# Patient Record
Sex: Female | Born: 1990 | Race: Black or African American | Hispanic: No | Marital: Single | Smoking: Former smoker
Health system: Southern US, Community
[De-identification: ages and names within clinical notes are randomized; demographics above are authoritative.]

## PROBLEM LIST (undated history)

## (undated) DIAGNOSIS — D649 Anemia, unspecified: Secondary | ICD-10-CM

## (undated) DIAGNOSIS — R87619 Unspecified abnormal cytological findings in specimens from cervix uteri: Secondary | ICD-10-CM

## (undated) DIAGNOSIS — N83209 Unspecified ovarian cyst, unspecified side: Secondary | ICD-10-CM

## (undated) DIAGNOSIS — E079 Disorder of thyroid, unspecified: Secondary | ICD-10-CM

## (undated) DIAGNOSIS — F419 Anxiety disorder, unspecified: Secondary | ICD-10-CM

## (undated) HISTORY — PX: WISDOM TOOTH EXTRACTION: SHX21

## (undated) HISTORY — PX: COLPOSCOPY: SHX161

## (undated) HISTORY — DX: Unspecified abnormal cytological findings in specimens from cervix uteri: R87.619

## (undated) HISTORY — DX: Anxiety disorder, unspecified: F41.9

## (undated) HISTORY — DX: Anemia, unspecified: D64.9

---

## 2010-11-15 ENCOUNTER — Inpatient Hospital Stay (INDEPENDENT_AMBULATORY_CARE_PROVIDER_SITE_OTHER)
Admission: RE | Admit: 2010-11-15 | Discharge: 2010-11-15 | Disposition: A | Discharge status: Home / Self Care | Payer: Self-pay | Source: Ambulatory Visit | Attending: Family Medicine | Admitting: Family Medicine

## 2010-11-15 DIAGNOSIS — H5710 Ocular pain, unspecified eye: Secondary | ICD-10-CM

## 2011-03-08 ENCOUNTER — Encounter: Payer: Self-pay | Admitting: Emergency Medicine

## 2011-03-08 ENCOUNTER — Emergency Department (HOSPITAL_BASED_OUTPATIENT_CLINIC_OR_DEPARTMENT_OTHER)
Admission: EM | Admit: 2011-03-08 | Discharge: 2011-03-08 | Disposition: A | Discharge status: Home / Self Care | Payer: PRIVATE HEALTH INSURANCE | Attending: Emergency Medicine | Admitting: Emergency Medicine

## 2011-03-08 DIAGNOSIS — R51 Headache: Secondary | ICD-10-CM | POA: Insufficient documentation

## 2011-03-08 DIAGNOSIS — J069 Acute upper respiratory infection, unspecified: Secondary | ICD-10-CM | POA: Insufficient documentation

## 2011-03-08 NOTE — ED Notes (Signed)
Headache with nausea and vomiting onset this am

## 2011-03-08 NOTE — ED Provider Notes (Signed)
History     CSN: [id number] Arrival date & time: 03/08/2011 11:29 AM   First MD Initiated Contact with Patient 03/08/11 1214    Patient in room 7 hped  Chief Complaint  Patient presents with  . Headache  Patient awoke this a.m. Early with headache nasal congestion, no fever, sneezing, scratchy throat, no cough.  Worst symptom is frontal headache and nausea.  Patient vomited once.  Headache is sharp.  Patient works as cna.    (Consider location/radiation/quality/duration/timing/severity/associated sxs/prior treatment) The history is provided by the patient.    History reviewed. No pertinent past medical history.  History reviewed. No pertinent past surgical history.  No family history on file.  History  Substance Use Topics  . Smoking status: Not on file  . Smokeless tobacco: Not on file  . Alcohol Use: Not on file    OB History    Grav Para Term Preterm Abortions TAB SAB Ect Mult Living                  Review of Systems  All other systems reviewed and are negative.    Allergies  Review of patient's allergies indicates not on file.  Home Medications  No current outpatient prescriptions on file.  BP 124/78  Pulse 53  Temp(Src) 98.1 F (36.7 C) (Oral)  Resp 18  Ht 5\' 7"  (1.702 m)  Wt 175 lb (79.379 kg)  BMI 27.41 kg/m2  SpO2 100%  Physical Exam  Nursing note and vitals reviewed. Constitutional: She appears well-developed and well-nourished.  HENT:  Head: Normocephalic and atraumatic.  Eyes: Conjunctivae and EOM are normal. Pupils are equal, round, and reactive to light.  Neck: Normal range of motion. Neck supple.  Cardiovascular: Normal rate and regular rhythm.   Pulmonary/Chest: Effort normal and breath sounds normal.  Abdominal: Soft. Bowel sounds are normal.  Musculoskeletal: Normal range of motion.  Neurological: She is alert. She has normal reflexes.  Skin: Skin is warm and dry.  Psychiatric: She has a normal mood and affect. Her behavior is  normal. Judgment and thought content normal.    ED Course  Procedures (including critical care time)  Labs Reviewed - No data to display No results found.   No diagnosis found.    MDM    Patient with normal hr of 60 on my exam.  Symtpoms c.w. Glenford Peers.        Hilario Quarry, MD 03/08/11 1341

## 2011-06-07 ENCOUNTER — Emergency Department (INDEPENDENT_AMBULATORY_CARE_PROVIDER_SITE_OTHER): Payer: No Typology Code available for payment source

## 2011-06-07 ENCOUNTER — Emergency Department (HOSPITAL_BASED_OUTPATIENT_CLINIC_OR_DEPARTMENT_OTHER)
Admission: EM | Admit: 2011-06-07 | Discharge: 2011-06-07 | Disposition: A | Discharge status: Home / Self Care | Payer: No Typology Code available for payment source | Attending: Emergency Medicine | Admitting: Emergency Medicine

## 2011-06-07 ENCOUNTER — Encounter (HOSPITAL_BASED_OUTPATIENT_CLINIC_OR_DEPARTMENT_OTHER): Payer: Self-pay | Admitting: *Deleted

## 2011-06-07 DIAGNOSIS — S139XXA Sprain of joints and ligaments of unspecified parts of neck, initial encounter: Secondary | ICD-10-CM | POA: Insufficient documentation

## 2011-06-07 DIAGNOSIS — F17200 Nicotine dependence, unspecified, uncomplicated: Secondary | ICD-10-CM | POA: Insufficient documentation

## 2011-06-07 DIAGNOSIS — M25569 Pain in unspecified knee: Secondary | ICD-10-CM

## 2011-06-07 DIAGNOSIS — Y92410 Unspecified street and highway as the place of occurrence of the external cause: Secondary | ICD-10-CM | POA: Insufficient documentation

## 2011-06-07 DIAGNOSIS — M542 Cervicalgia: Secondary | ICD-10-CM

## 2011-06-07 DIAGNOSIS — E041 Nontoxic single thyroid nodule: Secondary | ICD-10-CM

## 2011-06-07 DIAGNOSIS — R079 Chest pain, unspecified: Secondary | ICD-10-CM

## 2011-06-07 DIAGNOSIS — R51 Headache: Secondary | ICD-10-CM | POA: Insufficient documentation

## 2011-06-07 DIAGNOSIS — S161XXA Strain of muscle, fascia and tendon at neck level, initial encounter: Secondary | ICD-10-CM

## 2011-06-07 MED ORDER — OXYCODONE-ACETAMINOPHEN 5-325 MG PO TABS: 1.0000 | ORAL_TABLET | ORAL | Status: AC | PRN

## 2011-06-07 MED ORDER — OXYCODONE-ACETAMINOPHEN 5-325 MG PO TABS
1.0000 | ORAL_TABLET | Freq: Once | ORAL | Status: AC
  Administered 2011-06-07: 1 via ORAL
  Filled 2011-06-07: qty 1
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
## 2011-09-03 ENCOUNTER — Emergency Department (HOSPITAL_BASED_OUTPATIENT_CLINIC_OR_DEPARTMENT_OTHER)
Admission: EM | Admit: 2011-09-03 | Discharge: 2011-09-04 | Disposition: A | Discharge status: Home / Self Care | Payer: PRIVATE HEALTH INSURANCE | Attending: Emergency Medicine | Admitting: Emergency Medicine

## 2011-09-03 ENCOUNTER — Encounter (HOSPITAL_BASED_OUTPATIENT_CLINIC_OR_DEPARTMENT_OTHER): Payer: Self-pay | Admitting: *Deleted

## 2011-09-03 DIAGNOSIS — R51 Headache: Secondary | ICD-10-CM | POA: Insufficient documentation

## 2011-09-03 NOTE — ED Notes (Signed)
Pt states was seen at [facility] x 1 day ago for seizure and states she still has h/a

## 2011-09-04 MED ORDER — METOCLOPRAMIDE HCL 5 MG/ML IJ SOLN
10.0000 mg | Freq: Once | INTRAMUSCULAR | Status: AC
  Administered 2011-09-04: 10 mg via INTRAMUSCULAR
  Filled 2011-09-04: qty 2

## 2011-09-04 MED ORDER — DIPHENHYDRAMINE HCL 50 MG/ML IJ SOLN
25.0000 mg | Freq: Once | INTRAMUSCULAR | Status: AC
  Administered 2011-09-04: 25 mg via INTRAMUSCULAR
  Filled 2011-09-04: qty 1

## 2011-09-04 MED ORDER — KETOROLAC TROMETHAMINE 60 MG/2ML IM SOLN
60.0000 mg | Freq: Once | INTRAMUSCULAR | Status: AC
  Administered 2011-09-04: 60 mg via INTRAMUSCULAR
  Filled 2011-09-04: qty 2
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
## 2011-09-04 NOTE — Discharge Instructions (Signed)

## 2011-09-04 NOTE — ED Notes (Signed)
MD at bedside. 

## 2011-09-04 NOTE — ED Provider Notes (Signed)
History     CSN: [id number]  Arrival date & time 09/03/11  2214   First MD Initiated Contact with Patient 09/04/11 0023      Chief Complaint  Patient presents with  . Headache    (Consider location/radiation/quality/duration/timing/severity/associated sxs/prior treatment) Patient is a 21 y.o. female presenting with headaches. The history is provided by the patient. No language interpreter was used.  Headache  This is a new problem. The current episode started yesterday. The problem occurs constantly. The problem has not changed since onset.The headache is associated with nothing. The pain is located in the temporal (left) region. The quality of the pain is described as throbbing. The pain is at a severity of 9/10. The pain does not radiate. Pertinent negatives include no anorexia, no fever, no malaise/fatigue, no near-syncope, no orthopnea, no palpitations, no shortness of breath, no nausea and no vomiting. She has tried nothing for the symptoms. The treatment provided no relief.  Seen at [facility] last evening for AMS following alcohol and an ingestion (records from [facility] scanned in to chart) patient had an isolated seizure and had a negative CT scan but has a headache.  No visual nor speech changes no f/c/r.  No vomiting.  No rashes on the skin.    Past Medical History  Diagnosis Date  . Stroke     History reviewed. No pertinent past surgical history.  History reviewed. No pertinent family history.  History  Substance Use Topics  . Smoking status: Current Some Day Smoker  . Smokeless tobacco: Not on file  . Alcohol Use: No    OB History    Grav Para Term Preterm Abortions TAB SAB Ect Mult Living                  Review of Systems  Constitutional: Negative for fever and malaise/fatigue.  HENT: Negative for neck stiffness.   Eyes: Negative for photophobia and visual disturbance.  Respiratory: Negative for shortness of breath.   Cardiovascular: Negative for palpitations,  orthopnea and near-syncope.  Gastrointestinal: Negative for nausea, vomiting and anorexia.  Neurological: Positive for headaches. Negative for dizziness, tremors, facial asymmetry and numbness.  All other systems reviewed and are negative.    Allergies  Review of patient's allergies indicates no known allergies.  Home Medications  No current outpatient prescriptions on file.  BP 119/73  Pulse 63  Temp 97.6 F (36.4 C)  Resp 16  Ht 5\' 7"  (1.702 m)  Wt 191 lb (86.637 kg)  BMI 29.91 kg/m2  SpO2 100%  LMP 08/20/2011  Physical Exam  Constitutional: She is oriented to person, place, and time. She appears well-developed and well-nourished. No distress.  HENT:  Head: Normocephalic and atraumatic.  Right Ear: No mastoid tenderness. Tympanic membrane is not injected. No hemotympanum.  Left Ear: No mastoid tenderness. Tympanic membrane is not injected. No hemotympanum.  Mouth/Throat: Oropharynx is clear and moist. No oropharyngeal exudate.  Eyes: Conjunctivae and EOM are normal. Pupils are equal, round, and reactive to light.  Neck: Normal range of motion. Neck supple. No tracheal deviation present. No thyromegaly present.  Cardiovascular: Normal rate and regular rhythm.   Pulmonary/Chest: Effort normal and breath sounds normal.  Abdominal: Bowel sounds are normal. There is no tenderness. There is no rebound and no guarding.  Musculoskeletal: Normal range of motion.  Lymphadenopathy:    She has no cervical adenopathy.  Neurological: She is alert and oriented to person, place, and time. She has normal reflexes. No cranial nerve deficit. Coordination  normal.  Skin: Skin is warm and dry.  Psychiatric: She has a normal mood and affect.    ED Course  Procedures (including critical care time)   Labs Reviewed  PREGNANCY, URINE   No results found.   1. Headache       MDM  Negative work up within the past 24 hours.  No focal deficits.  No indication for LP.  Will treat and  have patient follow up with neurology.  Patient informed no driving x 6 months or until cleared by neurology.  Patient verbalizes understanding and agrees to follow up        Kesean Serviss Smitty Cords, MD 09/04/11 4098

## 2011-09-11 NOTE — ED Notes (Signed)
Patient here for work note.

## 2011-10-07 ENCOUNTER — Other Ambulatory Visit: Payer: Self-pay | Admitting: Diagnostic Neuroimaging

## 2011-10-07 DIAGNOSIS — R569 Unspecified convulsions: Secondary | ICD-10-CM

## 2011-11-28 ENCOUNTER — Encounter (HOSPITAL_BASED_OUTPATIENT_CLINIC_OR_DEPARTMENT_OTHER): Payer: Self-pay | Admitting: *Deleted

## 2011-11-28 ENCOUNTER — Emergency Department (HOSPITAL_BASED_OUTPATIENT_CLINIC_OR_DEPARTMENT_OTHER)
Admission: EM | Admit: 2011-11-28 | Discharge: 2011-11-28 | Disposition: A | Discharge status: Home / Self Care | Payer: PRIVATE HEALTH INSURANCE | Attending: Emergency Medicine | Admitting: Emergency Medicine

## 2011-11-28 DIAGNOSIS — F17200 Nicotine dependence, unspecified, uncomplicated: Secondary | ICD-10-CM | POA: Insufficient documentation

## 2011-11-28 DIAGNOSIS — R51 Headache: Secondary | ICD-10-CM | POA: Insufficient documentation

## 2011-11-28 HISTORY — DX: Disorder of thyroid, unspecified: E07.9

## 2011-11-28 MED ORDER — DEXAMETHASONE SODIUM PHOSPHATE 10 MG/ML IJ SOLN
10.0000 mg | Freq: Once | INTRAMUSCULAR | Status: AC
  Administered 2011-11-28: 10 mg via INTRAVENOUS
  Filled 2011-11-28: qty 1

## 2011-11-28 MED ORDER — METOCLOPRAMIDE HCL 5 MG/ML IJ SOLN
10.0000 mg | Freq: Once | INTRAMUSCULAR | Status: AC
  Administered 2011-11-28: 10 mg via INTRAVENOUS
  Filled 2011-11-28: qty 2

## 2011-11-28 MED ORDER — DIPHENHYDRAMINE HCL 50 MG/ML IJ SOLN
12.5000 mg | Freq: Once | INTRAMUSCULAR | Status: AC
  Administered 2011-11-28: 12.5 mg via INTRAVENOUS
  Filled 2011-11-28: qty 1

## 2011-11-28 NOTE — ED Notes (Signed)
Headache off and on since Monday describes as throbbing in top of head and pain behind eyes vomited x 2 this am

## 2011-11-28 NOTE — ED Provider Notes (Signed)
History     CSN: [id number]  Arrival date & time 11/28/11  1255   First MD Initiated Contact with Patient 11/28/11 1427      Chief Complaint  Patient presents with  . Headache    (Consider location/radiation/quality/duration/timing/severity/associated sxs/prior treatment) Patient is a 21 y.o. female presenting with headaches. The history is provided by the patient.  Headache  This is a new problem. The current episode started more than 2 days ago. The problem occurs constantly. The problem has not changed since onset.The headache is associated with bright light. The pain is located in the parietal, frontal and bilateral region. The pain is moderate. Associated symptoms include nausea and vomiting. Pertinent negatives include no fever. Associated symptoms comments: Headache for the past 5 days that is worse in bright light. No injury. No fever. She has had nausea with limited vomiting. It is not getting better or worse over time. Ibuprofen and Tylenol attempted without relief. .    Past Medical History  Diagnosis Date  . Thyroid disease     History reviewed. No pertinent past surgical history.  History reviewed. No pertinent family history.  History  Substance Use Topics  . Smoking status: Current Some Day Smoker  . Smokeless tobacco: Not on file  . Alcohol Use: No    OB History    Grav Para Term Preterm Abortions TAB SAB Ect Mult Living                  Review of Systems  Constitutional: Negative for fever.  HENT: Negative for congestion and sinus pressure.   Eyes: Positive for photophobia.  Gastrointestinal: Positive for nausea and vomiting. Negative for abdominal distention.  Skin: Negative for rash.  Neurological: Positive for headaches.    Allergies  Review of patient's allergies indicates no known allergies.  Home Medications  No current outpatient prescriptions on file.  BP 122/79  Pulse 79  Temp 98 F (36.7 C) (Oral)  Resp 18  Ht 5\' 7"  (1.702 m)   Wt 198 lb (89.812 kg)  BMI 31.01 kg/m2  SpO2 98%  LMP 11/16/2011  Physical Exam  Constitutional: She is oriented to person, place, and time. She appears well-developed and well-nourished.  HENT:  Head: Normocephalic.  Eyes: Pupils are equal, round, and reactive to light.  Neck: Normal range of motion. Neck supple.  Cardiovascular: Normal rate and regular rhythm.   Pulmonary/Chest: Effort normal and breath sounds normal.  Abdominal: Soft. Bowel sounds are normal. There is no tenderness. There is no rebound and no guarding.  Musculoskeletal: Normal range of motion.  Neurological: She is alert and oriented to person, place, and time. She has normal strength and normal reflexes. No sensory deficit. She displays a negative Romberg sign. Coordination normal.       Cranial nerves 3-12 grossly intact. No cerebellar dysfunction. Normal gait.   Skin: Skin is warm and dry. No rash noted.  Psychiatric: She has a normal mood and affect.    ED Course  Procedures (including critical care time)  Labs Reviewed - No data to display No results found.   No diagnosis found.  1. Headache   MDM  Headache the same for 5 days without neurologic deficits on exam. Doubt bleed or infection as cause of headache. Will give symptomatic treatment and reassess.   4:30:  Patient's headache is completely resolved. Will discharge home.        Rodena Medin, PA-C 11/28/11 1631

## 2011-11-29 NOTE — ED Provider Notes (Signed)
Medical screening examination/treatment/procedure(s) were performed by non-physician practitioner and as supervising physician I was immediately available for consultation/collaboration.   Tirth Cothron Y. Annelise Mccoy, MD 11/29/11 0906 

## 2012-03-09 ENCOUNTER — Emergency Department (HOSPITAL_BASED_OUTPATIENT_CLINIC_OR_DEPARTMENT_OTHER)
Admission: EM | Admit: 2012-03-09 | Discharge: 2012-03-09 | Disposition: A | Discharge status: Home / Self Care | Payer: Self-pay | Attending: Emergency Medicine | Admitting: Emergency Medicine

## 2012-03-09 ENCOUNTER — Encounter (HOSPITAL_BASED_OUTPATIENT_CLINIC_OR_DEPARTMENT_OTHER): Payer: Self-pay | Admitting: Emergency Medicine

## 2012-03-09 ENCOUNTER — Emergency Department (HOSPITAL_BASED_OUTPATIENT_CLINIC_OR_DEPARTMENT_OTHER): Payer: Self-pay

## 2012-03-09 DIAGNOSIS — M940 Chondrocostal junction syndrome [Tietze]: Secondary | ICD-10-CM | POA: Insufficient documentation

## 2012-03-09 DIAGNOSIS — E079 Disorder of thyroid, unspecified: Secondary | ICD-10-CM | POA: Insufficient documentation

## 2012-03-09 DIAGNOSIS — F17200 Nicotine dependence, unspecified, uncomplicated: Secondary | ICD-10-CM | POA: Insufficient documentation

## 2012-03-09 MED ORDER — NAPROXEN 500 MG PO TABS: 500.0000 mg | ORAL_TABLET | Freq: Two times a day (BID) | ORAL | Status: DC

## 2012-03-09 MED ORDER — ALBUTEROL SULFATE HFA 108 (90 BASE) MCG/ACT IN AERS
1.0000 | INHALATION_SPRAY | RESPIRATORY_TRACT | Status: DC | PRN
  Administered 2012-03-09: 2 via RESPIRATORY_TRACT
  Filled 2012-03-09: qty 6.7

## 2012-03-09 MED ORDER — IBUPROFEN 800 MG PO TABS
800.0000 mg | ORAL_TABLET | Freq: Once | ORAL | Status: AC
  Administered 2012-03-09: 800 mg via ORAL
  Filled 2012-03-09: qty 1

## 2012-03-09 NOTE — ED Provider Notes (Signed)
History     CSN: [id number] Arrival date & time 03/09/12  2009 First MD Initiated Contact with Patient 03/09/12 2148     Chief Complaint  Patient presents with  . Shortness of Breath   Patient is a 21 y.o. female presenting with shortness of breath. The history is provided by the patient.  Shortness of Breath  The current episode started yesterday. The onset was gradual. The problem occurs continuously. The problem is mild. Nothing relieves the symptoms. Nothing aggravates the symptoms. Associated symptoms include chest pain, shortness of breath and wheezing. Pertinent negatives include no fever, no rhinorrhea, no sore throat and no cough. Her past medical history does not include asthma.  No leg swelling.  No OCP.  No travel.  No history of DVT or PE.  Past Medical History  Diagnosis Date  . Thyroid disease     History reviewed. No pertinent past surgical history.  No family history on file.  History  Substance Use Topics  . Smoking status: Current Every Day Smoker    Types: Cigars  . Smokeless tobacco: Never Used  . Alcohol Use: No    OB History    Grav Para Term Preterm Abortions TAB SAB Ect Mult Living                  Review of Systems  Constitutional: Negative for fever.  HENT: Negative for sore throat and rhinorrhea.   Respiratory: Positive for shortness of breath and wheezing. Negative for cough.   Cardiovascular: Positive for chest pain.  All other systems reviewed and are negative.    Allergies  Review of patient's allergies indicates no known allergies.  Home Medications   Current Outpatient Rx  Name Route Sig Dispense Refill  . IBUPROFEN 200 MG PO TABS Oral Take 200 mg by mouth every 6 (six) hours as needed. Patient used this medication for her headache.      BP 113/65  Pulse 64  Temp 98.2 F (36.8 C) (Oral)  Resp 20  Ht 5\' 6"  (1.676 m)  Wt 201 lb (91.173 kg)  BMI 32.44 kg/m2  SpO2 100%  LMP 03/06/2012  Physical Exam  Nursing note and  vitals reviewed. Constitutional: She appears well-developed and well-nourished. No distress.  HENT:  Head: Normocephalic and atraumatic.  Right Ear: External ear normal.  Left Ear: External ear normal.  Eyes: Conjunctivae normal are normal. Right eye exhibits no discharge. Left eye exhibits no discharge. No scleral icterus.  Neck: Neck supple. No tracheal deviation present.  Cardiovascular: Normal rate, regular rhythm and intact distal pulses.   Pulmonary/Chest: Effort normal and breath sounds normal. No stridor. No respiratory distress. She has no wheezes. She has no rales. She exhibits tenderness.  Abdominal: Soft. Bowel sounds are normal. She exhibits no distension. There is no tenderness. There is no rebound and no guarding.  Musculoskeletal: She exhibits no edema and no tenderness.  Neurological: She is alert. She has normal strength. No sensory deficit. Cranial nerve deficit:  no gross defecits noted. She exhibits normal muscle tone. She displays no seizure activity. Coordination normal.  Skin: Skin is warm and dry. No rash noted.  Psychiatric: She has a normal mood and affect.    ED Course  Procedures (including critical care time)  Rate: 58  Rhythm: normal sinus rhythm  QRS Axis: normal  Intervals: normal  ST/T Wave abnormalities: normal  Conduction Disutrbances:none  Narrative Interpretation: Sinus arrhythmia  Old EKG Reviewed: none available  Labs Reviewed - No data to  display Dg Chest 2 View  03/09/2012  *RADIOLOGY REPORT*  Clinical Data: Shortness of breath.  CHEST - 2 VIEW  Comparison: 05/28/2011  Findings: Two views of the chest demonstrate clear lungs. Heart and mediastinum are within normal limits.  The trachea is midline. Bony thorax is intact.  IMPRESSION: Normal chest examination.   Original Report Authenticated By: Richarda Overlie, M.D.       MDM  Patient is low risk for PE. She is perc negative.  Suspect her symptoms may be related to costochondritis. She is not  wheezing here but she felt she was wheezing earlier. She be discharged home with a prescription for anti-inflammatory medications and inhaler. When she discontinue smoking. She is to follow up with a primary care Dr. if the symptoms are not getting better. She is to return emergently for any worsening symptoms        Celene Kras, MD 03/09/12 2218

## 2012-03-09 NOTE — ED Notes (Signed)
Shortness of breath since yesterday.  Worse last night and today. Wheezing.  Denies hx of Asthma or similar sx.  Denies recent URI. Sts she is a smoker.

## 2012-10-23 DIAGNOSIS — J302 Other seasonal allergic rhinitis: Secondary | ICD-10-CM | POA: Insufficient documentation

## 2012-10-23 DIAGNOSIS — R569 Unspecified convulsions: Secondary | ICD-10-CM | POA: Insufficient documentation

## 2012-10-23 DIAGNOSIS — N939 Abnormal uterine and vaginal bleeding, unspecified: Secondary | ICD-10-CM | POA: Insufficient documentation

## 2012-10-23 DIAGNOSIS — J3089 Other allergic rhinitis: Secondary | ICD-10-CM | POA: Insufficient documentation

## 2016-10-27 DIAGNOSIS — L230 Allergic contact dermatitis due to metals: Secondary | ICD-10-CM | POA: Insufficient documentation

## 2017-02-20 ENCOUNTER — Emergency Department (HOSPITAL_BASED_OUTPATIENT_CLINIC_OR_DEPARTMENT_OTHER)
Admission: EM | Admit: 2017-02-20 | Discharge: 2017-02-21 | Disposition: A | Discharge status: Home / Self Care | Payer: PRIVATE HEALTH INSURANCE | Attending: Emergency Medicine | Admitting: Emergency Medicine

## 2017-02-20 ENCOUNTER — Encounter (HOSPITAL_BASED_OUTPATIENT_CLINIC_OR_DEPARTMENT_OTHER): Payer: Self-pay | Admitting: Adult Health

## 2017-02-20 DIAGNOSIS — N939 Abnormal uterine and vaginal bleeding, unspecified: Secondary | ICD-10-CM

## 2017-02-20 DIAGNOSIS — E079 Disorder of thyroid, unspecified: Secondary | ICD-10-CM | POA: Insufficient documentation

## 2017-02-20 DIAGNOSIS — N83202 Unspecified ovarian cyst, left side: Secondary | ICD-10-CM | POA: Diagnosis not present

## 2017-02-20 DIAGNOSIS — R102 Pelvic and perineal pain: Secondary | ICD-10-CM | POA: Insufficient documentation

## 2017-02-20 DIAGNOSIS — N938 Other specified abnormal uterine and vaginal bleeding: Secondary | ICD-10-CM | POA: Diagnosis present

## 2017-02-20 DIAGNOSIS — F17290 Nicotine dependence, other tobacco product, uncomplicated: Secondary | ICD-10-CM | POA: Diagnosis not present

## 2017-02-20 LAB — COMPREHENSIVE METABOLIC PANEL
ALBUMIN: 3.9 g/dL (ref 3.5–5.0)
ALT: 18 U/L (ref 14–54)
AST: 18 U/L (ref 15–41)
Alkaline Phosphatase: 41 U/L (ref 38–126)
Anion gap: 5 (ref 5–15)
BILIRUBIN TOTAL: 0.3 mg/dL (ref 0.3–1.2)
BUN: 12 mg/dL (ref 6–20)
CO2: 25 mmol/L (ref 22–32)
Calcium: 9.1 mg/dL (ref 8.9–10.3)
Chloride: 107 mmol/L (ref 101–111)
Creatinine, Ser: 0.64 mg/dL (ref 0.44–1.00)
GFR calc Af Amer: >60 mL/min (ref >60)
GFR calc non Af Amer: >60 mL/min (ref >60)
GLUCOSE: 100 mg/dL — AB (ref 65–99)
POTASSIUM: 3.9 mmol/L (ref 3.5–5.1)
Sodium: 137 mmol/L (ref 135–145)
TOTAL PROTEIN: 7.1 g/dL (ref 6.5–8.1)

## 2017-02-20 LAB — URINALYSIS, ROUTINE W REFLEX MICROSCOPIC
Bilirubin Urine: NEGATIVE
GLUCOSE, UA: NEGATIVE mg/dL
Ketones, ur: NEGATIVE mg/dL
LEUKOCYTES UA: NEGATIVE
Nitrite: NEGATIVE
PROTEIN: NEGATIVE mg/dL
Specific Gravity, Urine: 1.025 (ref 1.005–1.030)
pH: 6 (ref 5.0–8.0)

## 2017-02-20 LAB — CBC WITH DIFFERENTIAL/PLATELET
BASOS PCT: 0 %
Basophils Absolute: 0 10*3/uL (ref 0.0–0.1)
EOS PCT: 2 %
Eosinophils Absolute: 0.1 10*3/uL (ref 0.0–0.7)
HEMATOCRIT: 36.6 % (ref 36.0–46.0)
Hemoglobin: 11.9 g/dL — ABNORMAL LOW (ref 12.0–15.0)
Lymphocytes Relative: 39 %
Lymphs Abs: 2.5 10*3/uL (ref 0.7–4.0)
MCH: 26.9 pg (ref 26.0–34.0)
MCHC: 32.5 g/dL (ref 30.0–36.0)
MCV: 82.6 fL (ref 78.0–100.0)
MONO ABS: 0.5 10*3/uL (ref 0.1–1.0)
MONOS PCT: 8 %
NEUTROS ABS: 3.4 10*3/uL (ref 1.7–7.7)
Neutrophils Relative %: 51 %
PLATELETS: 257 10*3/uL (ref 150–400)
RBC: 4.43 MIL/uL (ref 3.87–5.11)
RDW: 13.4 % (ref 11.5–15.5)
WBC: 6.5 10*3/uL (ref 4.0–10.5)

## 2017-02-20 LAB — URINALYSIS, MICROSCOPIC (REFLEX): WBC, UA: NONE SEEN WBC/hpf (ref 0–5)

## 2017-02-20 LAB — WET PREP, GENITAL
SPERM: NONE SEEN
TRICH WET PREP: NONE SEEN
YEAST WET PREP: NONE SEEN

## 2017-02-20 LAB — PREGNANCY, URINE: PREG TEST UR: NEGATIVE

## 2017-02-20 MED ORDER — ONDANSETRON HCL 4 MG/2ML IJ SOLN
4.0000 mg | Freq: Once | INTRAMUSCULAR | Status: AC
  Administered 2017-02-20: 4 mg via INTRAVENOUS
  Filled 2017-02-20: qty 2

## 2017-02-20 MED ORDER — SODIUM CHLORIDE 0.9 % IV BOLUS (SEPSIS): 1000.0000 mL | Freq: Once | INTRAVENOUS | Status: DC

## 2017-02-20 MED ORDER — ACETAMINOPHEN 325 MG PO TABS
650.0000 mg | ORAL_TABLET | Freq: Once | ORAL | Status: AC
  Administered 2017-02-20: 650 mg via ORAL
  Filled 2017-02-20: qty 2

## 2017-02-20 MED ORDER — FENTANYL CITRATE (PF) 100 MCG/2ML IJ SOLN
50.0000 ug | Freq: Once | INTRAMUSCULAR | Status: AC
  Administered 2017-02-20: 50 ug via INTRAVENOUS
  Filled 2017-02-20: qty 2

## 2017-02-20 MED ORDER — SODIUM CHLORIDE 0.9 % IV BOLUS (SEPSIS)
1000.0000 mL | Freq: Once | INTRAVENOUS | Status: AC
  Administered 2017-02-20: 1000 mL via INTRAVENOUS

## 2017-02-20 NOTE — ED Notes (Signed)
Pt c/o heavy vaginal bleeding that started today.  Pt states that when she went to the bathroom to get a urine sample, her bleeding slowed down.

## 2017-02-20 NOTE — ED Triage Notes (Addendum)
PResents with vaginal bleeding, Right lower pelvic pain, vaginal pain and  and lower back pain. Pt began period on Tuesday and had some heaviness and clots, Today she was going through a tampon every 20 minutes and it was very heavy. She states she normally doesn't have this heavy of a period and doesn't have this much pain.SHe is sexually active and does not use birth control. LMP before this one was 01/21/17. She took 800 mg of Ibuprofen at 3:40 today

## 2017-02-21 ENCOUNTER — Emergency Department (HOSPITAL_BASED_OUTPATIENT_CLINIC_OR_DEPARTMENT_OTHER): Payer: PRIVATE HEALTH INSURANCE

## 2017-02-21 MED ORDER — IBUPROFEN 600 MG PO TABS: 600.0000 mg | ORAL_TABLET | Freq: Three times a day (TID) | ORAL | 0 refills | Status: DC | PRN

## 2017-02-21 MED ORDER — ONDANSETRON 8 MG PO TBDP: 8.0000 mg | ORAL_TABLET | Freq: Three times a day (TID) | ORAL | 0 refills | Status: DC | PRN

## 2017-02-21 MED ORDER — IOPAMIDOL (ISOVUE-300) INJECTION 61%
100.0000 mL | Freq: Once | INTRAVENOUS | Status: AC | PRN
  Administered 2017-02-21: 100 mL via INTRAVENOUS

## 2017-02-21 NOTE — ED Notes (Signed)
Pt never called out to say her ride was here, she signed out with registration and there was never a ride out front.  Per registration, no one came in for the patient and there was no one in the front circle to pick her up.

## 2017-02-21 NOTE — ED Notes (Signed)
Pt verbalizes understanding of dc instructions and denies any further needs at this time.  She is waiting for ride in treatment room.

## 2017-02-21 NOTE — ED Provider Notes (Signed)
[facility]   CSN: [id number] Arrival date & time: 02/20/17  1951     History   Chief Complaint Chief Complaint  Patient presents with  . Vaginal Bleeding    HPI Bonnie Arnold is a 26 y.o. female.He presents emergency Department with chief complaint of vaginal bleeding and pain. Patient states that she began her menstrual cycle 2 days ago and has had excessive pelvic pain out of proportion to her previous menstrual cycles. She has pain in the entire pelvis but is worse on the left. It is constant, crampy. She took 600 mg Motrin without relief of her symptoms She complains of bleeding however it is not any heavier than normal although she has had more clotting than usual. She denies feelings of presyncope, racing heart or lightheadedness. She is sexually active with a single female partner. She denies any other vaginal symptoms.  HPI  Past Medical History:  Diagnosis Date  . Thyroid disease     There are no active problems to display for this patient.   History reviewed. No pertinent surgical history.  OB History    No data available       Home Medications    Prior to Admission medications   Medication Sig Start Date End Date Taking? Authorizing Provider  ibuprofen (ADVIL,MOTRIN) 200 MG tablet Take 200 mg by mouth every 6 (six) hours as needed. Patient used this medication for her headache.    [provider]  naproxen (NAPROSYN) 500 MG tablet Take 1 tablet (500 mg total) by mouth 2 (two) times daily. 03/09/12   Linwood Dibbles, MD    Family History History reviewed. No pertinent family history.  Social History Social History  Substance Use Topics  . Smoking status: Current Every Day Smoker    Types: Cigars  . Smokeless tobacco: Never Used  . Alcohol use No     Allergies   Patient has no known allergies.   Review of Systems Review of Systems  Ten systems reviewed and are negative for acute change, except as noted in the  HPI.   Physical Exam Updated Vital Signs BP 115/79 (BP Location: Left Arm)   Pulse (!) 49   Temp 98.4 F (36.9 C) (Oral)   Resp 20   Wt 85.7 kg (189 lb)   LMP 02/17/2017 (Approximate)   SpO2 100%   BMI 30.51 kg/m   Physical Exam  Constitutional: She is oriented to person, place, and time. She appears well-developed and well-nourished. No distress.  HENT:  Head: Normocephalic and atraumatic.  Eyes: Conjunctivae are normal. No scleral icterus.  Neck: Normal range of motion.  Cardiovascular: Normal rate, regular rhythm and normal heart sounds.  Exam reveals no gallop and no friction rub.   No murmur heard. Pulmonary/Chest: Effort normal and breath sounds normal. No respiratory distress.  Abdominal: Soft. Bowel sounds are normal. She exhibits no distension and no mass. There is no tenderness. There is no guarding.  Genitourinary:  Genitourinary Comments: Pelvic exam: normal external genitalia, vulva, vagina, cervix, uterus and adnexa. Mild tenderness through ought.  Neurological: She is alert and oriented to person, place, and time.  Skin: Skin is warm and dry. She is not diaphoretic.  Psychiatric: Her behavior is normal.  Nursing note and vitals reviewed.    ED Treatments / Results  Labs (all labs ordered are listed, but only abnormal results are displayed) Labs Reviewed  WET PREP, GENITAL - Abnormal; Notable for the following:  Result Value   Clue Cells Wet Prep HPF POC PRESENT (*)    WBC, Wet Prep HPF POC MODERATE (*)    All other components within normal limits  CBC WITH DIFFERENTIAL/PLATELET - Abnormal; Notable for the following:    Hemoglobin 11.9 (*)    All other components within normal limits  COMPREHENSIVE METABOLIC PANEL - Abnormal; Notable for the following:    Glucose, Bld 100 (*)    All other components within normal limits  URINALYSIS, ROUTINE W REFLEX MICROSCOPIC - Abnormal; Notable for the following:    APPearance CLOUDY (*)    Hgb urine dipstick  LARGE (*)    All other components within normal limits  URINALYSIS, MICROSCOPIC (REFLEX) - Abnormal; Notable for the following:    Bacteria, UA RARE (*)    Squamous Epithelial / LPF 6-30 (*)    All other components within normal limits  PREGNANCY, URINE  GC/CHLAMYDIA PROBE AMP ([place]) NOT AT [facility]    EKG  EKG Interpretation None       Radiology No results found.  Procedures Procedures (including critical care time)  Medications Ordered in ED Medications  sodium chloride 0.9 % bolus 1,000 mL (not administered)  iopamidol (ISOVUE-300) 61 % injection 100 mL (not administered)  sodium chloride 0.9 % bolus 1,000 mL (0 mLs Intravenous Stopped 02/20/17 2229)  acetaminophen (TYLENOL) tablet 650 mg (650 mg Oral Given 02/20/17 2228)  fentaNYL (SUBLIMAZE) injection 50 mcg (50 mcg Intravenous Given 02/20/17 2347)  ondansetron (ZOFRAN) injection 4 mg (4 mg Intravenous Given 02/20/17 2349)     Initial Impression / Assessment and Plan / ED Course  I have reviewed the triage vital signs and the nursing notes.  Pertinent labs & imaging results that were available during my care of the patient were reviewed by me and considered in my medical decision making (see chart for details).     Patient complaining of vaginal bleeding and pain. Her pelvic exam and abdominal exam are both benign. There is no ultrasound available. We'll proceed with CT scan of the abdomen and pelvis. Given sign out to Dr. Patria Mane who assumed care of the patient.  Final Clinical Impressions(s) / ED Diagnoses   Final diagnoses:  None    New Prescriptions New Prescriptions   No medications on file     Delos Haring 02/21/17 Sharalyn Ink, MD 02/21/17 [phone]

## 2017-02-23 LAB — GC/CHLAMYDIA PROBE AMP (~~LOC~~) NOT AT ARMC
CHLAMYDIA, DNA PROBE: NEGATIVE
Neisseria Gonorrhea: NEGATIVE

## 2017-10-14 DIAGNOSIS — N926 Irregular menstruation, unspecified: Secondary | ICD-10-CM | POA: Insufficient documentation

## 2017-11-11 ENCOUNTER — Encounter: Payer: Self-pay | Admitting: Emergency Medicine

## 2017-11-11 ENCOUNTER — Emergency Department (INDEPENDENT_AMBULATORY_CARE_PROVIDER_SITE_OTHER)
Admission: EM | Admit: 2017-11-11 | Discharge: 2017-11-11 | Disposition: A | Discharge status: Home / Self Care | Payer: Managed Care, Other (non HMO) | Source: Home / Self Care

## 2017-11-11 DIAGNOSIS — R102 Pelvic and perineal pain: Secondary | ICD-10-CM | POA: Diagnosis not present

## 2017-11-11 DIAGNOSIS — B9689 Other specified bacterial agents as the cause of diseases classified elsewhere: Secondary | ICD-10-CM

## 2017-11-11 DIAGNOSIS — N760 Acute vaginitis: Secondary | ICD-10-CM

## 2017-11-11 LAB — POCT URINALYSIS DIP (MANUAL ENTRY)
BILIRUBIN UA: NEGATIVE mg/dL
Bilirubin, UA: NEGATIVE
Glucose, UA: NEGATIVE mg/dL
LEUKOCYTES UA: NEGATIVE
NITRITE UA: NEGATIVE
PH UA: 7.5 (ref 5.0–8.0)
PROTEIN UA: NEGATIVE mg/dL
RBC UA: NEGATIVE
Spec Grav, UA: 1.02 (ref 1.010–1.025)
Urobilinogen, UA: 0.2 E.U./dL

## 2017-11-11 MED ORDER — METRONIDAZOLE 500 MG PO TABS: 500.0000 mg | ORAL_TABLET | Freq: Two times a day (BID) | ORAL | 0 refills | Status: DC

## 2017-11-11 NOTE — ED Provider Notes (Signed)
[name]    CSN: [id number] Arrival date & time: 11/11/17  1441     History   Chief Complaint Chief Complaint  Patient presents with  . Pelvic Pain    HPI Bonnie Arnold is a 27 y.o. female.   The history is provided by the patient. No language interpreter was used.  Pelvic Pain  This is a new problem. The current episode started more than 1 week ago. The problem occurs constantly. The problem has been gradually worsening. Associated symptoms include abdominal pain. Nothing aggravates the symptoms. Nothing relieves the symptoms. She has tried nothing for the symptoms. The treatment provided no relief.  Pt has new partner.  Pt admits concern about std.    Past Medical History:  Diagnosis Date  . Thyroid disease     There are no active problems to display for this patient.   History reviewed. No pertinent surgical history.  OB History   None      Home Medications    Prior to Admission medications   Medication Sig Start Date End Date Taking? Authorizing Provider  ibuprofen (ADVIL,MOTRIN) 600 MG tablet Take 1 tablet (600 mg total) by mouth every 8 (eight) hours as needed. 02/21/17   Azalia Bilis, MD  metroNIDAZOLE (FLAGYL) 500 MG tablet Take 1 tablet (500 mg total) by mouth 2 (two) times daily. 11/11/17   Elson Areas, PA-C  naproxen (NAPROSYN) 500 MG tablet Take 1 tablet (500 mg total) by mouth 2 (two) times daily. 03/09/12   Linwood Dibbles, MD  ondansetron (ZOFRAN ODT) 8 MG disintegrating tablet Take 1 tablet (8 mg total) by mouth every 8 (eight) hours as needed for nausea or vomiting. 02/21/17   Azalia Bilis, MD    Family History History reviewed. No pertinent family history.  Social History Social History   Tobacco Use  . Smoking status: Current Every Day Smoker    Types: Cigars  . Smokeless tobacco: Never Used  Substance Use Topics  . Alcohol use: No  . Drug use: No     Allergies   Patient has no known allergies.   Review of  Systems Review of Systems  Gastrointestinal: Positive for abdominal pain.  Genitourinary: Positive for pelvic pain.  All other systems reviewed and are negative.    Physical Exam Triage Vital Signs ED Triage Vitals  Enc Vitals Group     BP 11/11/17 1527 119/84     Pulse Rate 11/11/17 1527 78     Resp --      Temp 11/11/17 1527 98.1 F (36.7 C)     Temp Source 11/11/17 1527 Oral     SpO2 11/11/17 1527 99 %     Weight 11/11/17 1528 214 lb (97.1 kg)     Height --      Head Circumference --      Peak Flow --      Pain Score 11/11/17 1528 8     Pain Loc --      Pain Edu? --      Excl. in GC? --    No data found.  Updated Vital Signs BP 119/84 (BP Location: Right Arm)   Pulse 78   Temp 98.1 F (36.7 C) (Oral)   Wt 214 lb (97.1 kg)   SpO2 99%   BMI 34.54 kg/m   Visual Acuity Right Eye Distance:   Left Eye Distance:   Bilateral Distance:    Right Eye Near:   Left Eye Near:    Bilateral  Near:     Physical Exam  Constitutional: She appears well-developed and well-nourished.  HENT:  Head: Normocephalic and atraumatic.  Eyes: Pupils are equal, round, and reactive to light. Conjunctivae are normal.  Neck: Normal range of motion. Neck supple.  Cardiovascular: Normal rate.  Abdominal: Soft. There is no tenderness.  Genitourinary: Vaginal discharge found.  Genitourinary Comments: Vaginal discharge,  Thin white,  No adnxal tenderness,  Cervix slight erythema   Musculoskeletal: Normal range of motion.  Skin: Skin is warm.     UC Treatments / Results  Labs (all labs ordered are listed, but only abnormal results are displayed) Labs Reviewed  POCT URINALYSIS DIP (MANUAL ENTRY) - Abnormal; Notable for the following components:      Result Value   Clarity, UA cloudy (*)    All other components within normal limits  C. TRACHOMATIS/N. GONORRHOEAE RNA  WET PREP BY MOLECULAR PROBE  CERVICOVAGINAL ANCILLARY ONLY    EKG None  Radiology No results  found.  Procedures Procedures (including critical care time)  Medications Ordered in UC Medications - No data to display  Initial Impression / Assessment and Plan / UC Course  I have reviewed the triage vital signs and the nursing notes.  Pertinent labs & imaging results that were available during my care of the patient were reviewed by me and considered in my medical decision making (see chart for details).     Pt given rx for flagyl,  gc and ct pending.   Pt advised to go to Ed if symptoms worsen or change.  Final Clinical Impressions(s) / UC Diagnoses   Final diagnoses:  Pelvic pain in female  BV (bacterial vaginosis)     Discharge Instructions     Follow up with your gynecologist for recheck.  You have cultures pending.  Go to the Emergency department if pain worsens of changes.    ED Prescriptions    Medication Sig Dispense Auth. Provider   metroNIDAZOLE (FLAGYL) 500 MG tablet Take 1 tablet (500 mg total) by mouth 2 (two) times daily. 14 tablet Elson AreasSofia, Khadeja Abt K, [place]-C     Controlled Substance Prescriptions [place] Controlled Substance Registry consulted? Not Applicable   Elson AreasSofia, Tye Juarez K, [place] 11/11/17 1646

## 2017-11-11 NOTE — Discharge Instructions (Signed)
Follow up with your gynecologist for recheck.  You have cultures pending.  Go to the Emergency department if pain worsens of changes.

## 2017-11-11 NOTE — ED Triage Notes (Signed)
Password [phone]

## 2017-11-11 NOTE — ED Triage Notes (Signed)
Pt c/o nausea, low back pain and pelvic pain since yesterday. States she was seen yesterday in another UC for rash.

## 2017-11-12 ENCOUNTER — Telehealth: Payer: Self-pay

## 2017-11-12 LAB — WET PREP BY MOLECULAR PROBE
CANDIDA SPECIES: NOT DETECTED
MICRO NUMBER:: 90765810
SPECIMEN QUALITY:: ADEQUATE
Trichomonas vaginosis: NOT DETECTED

## 2017-11-12 LAB — C. TRACHOMATIS/N. GONORRHOEAE RNA
C. trachomatis RNA, TMA: NOT DETECTED
N. GONORRHOEAE RNA, TMA: NOT DETECTED

## 2017-11-12 NOTE — Telephone Encounter (Signed)
Notified patient of results and [facility]

## 2017-11-12 NOTE — Telephone Encounter (Signed)
Left message to call UC for lab results and pharmacy information.

## 2017-12-24 ENCOUNTER — Other Ambulatory Visit: Payer: Self-pay

## 2017-12-24 ENCOUNTER — Emergency Department (INDEPENDENT_AMBULATORY_CARE_PROVIDER_SITE_OTHER)
Admission: EM | Admit: 2017-12-24 | Discharge: 2017-12-24 | Disposition: A | Discharge status: Home / Self Care | Payer: Managed Care, Other (non HMO) | Source: Home / Self Care | Attending: Emergency Medicine | Admitting: Emergency Medicine

## 2017-12-24 ENCOUNTER — Encounter: Payer: Self-pay | Admitting: *Deleted

## 2017-12-24 ENCOUNTER — Emergency Department (INDEPENDENT_AMBULATORY_CARE_PROVIDER_SITE_OTHER): Payer: Managed Care, Other (non HMO)

## 2017-12-24 DIAGNOSIS — R202 Paresthesia of skin: Secondary | ICD-10-CM

## 2017-12-24 DIAGNOSIS — R200 Anesthesia of skin: Secondary | ICD-10-CM | POA: Diagnosis not present

## 2017-12-24 DIAGNOSIS — M79601 Pain in right arm: Secondary | ICD-10-CM

## 2017-12-24 DIAGNOSIS — M79641 Pain in right hand: Secondary | ICD-10-CM | POA: Diagnosis not present

## 2017-12-24 LAB — POCT URINE PREGNANCY: Preg Test, Ur: NEGATIVE

## 2017-12-24 MED ORDER — TRAMADOL HCL 50 MG PO TABS: 50.0000 mg | ORAL_TABLET | Freq: Four times a day (QID) | ORAL | 0 refills | Status: DC | PRN

## 2017-12-24 MED ORDER — GABAPENTIN 100 MG PO CAPS: ORAL_CAPSULE | ORAL | 0 refills | Status: DC

## 2017-12-24 MED ORDER — MELOXICAM 7.5 MG PO TABS: ORAL_TABLET | ORAL | 0 refills | Status: DC

## 2017-12-24 NOTE — ED Triage Notes (Signed)
Patient c/o pain that started suddenly during the night in her right fingers that goes up to her elbow. C/o numbness in fingers. Pain is getting worse, now up to her back. No recent injury, bite/sting or new activity.

## 2017-12-24 NOTE — Discharge Instructions (Signed)
Take Mobic twice a day with food. You will have Ultram to take for breakthrough pain. Take gabapentin 100 mg 1-2 up to 3 times a day for pain. We will try and make an appointment for you to follow-up with orthopedics. Wear your sling and wrist splint as needed. Stay off of your birth control pills for now.

## 2017-12-24 NOTE — ED Provider Notes (Signed)
[facility]    CSN: [id number] Arrival date & time: 12/24/17  1004     History   Chief Complaint Chief Complaint  Patient presents with  . Hand Pain    HPI Bonnie Arnold is a 27 y.o. female.  Patient states her symptoms began a little over a week ago.  She has had a severe aching numb sensation in her right hand at a level of 9 out of 10.  Yesterday she began having pain radiating up her forearm and into the upper arm and right side of her neck.  She states there is some worsening of pain with neck movement but initially there was no neck pain associated with her hand discomfort.  She feels somewhat weak in her hand but feels it is related to the numbness.  She has a history of some thyroid problems but has not had this checked for a number of years.  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  She cannot recall a specific injury where she hurt herself.  She started birth control pills recently but stopped them 2 days ago with concerns that possibly this was triggering her pain in her arm. HPI  Past Medical History:  Diagnosis Date  . Thyroid disease     There are no active problems to display for this patient.   History reviewed. No pertinent surgical history.  OB History   None      Home Medications    Prior to Admission medications   Medication Sig Start Date End Date Taking? Authorizing Provider  Norethin Ace-Eth Estrad-FE (LARIN 24 FE PO) Take by mouth.   Yes [provider]  gabapentin (NEURONTIN) 100 MG capsule Take 1 to 2 capsules every 8 hours as needed for right arm pain 12/24/17   Collene Gobbleaub, Darvis Croft A, MD  meloxicam (MOBIC) 7.5 MG tablet Take 1 tablet twice a day with food. 12/24/17   Collene Gobbleaub, Vernel Donlan A, MD  traMADol (ULTRAM) 50 MG tablet Take 1 tablet (50 mg total) by mouth every 6 (six) hours as needed. 12/24/17   Collene Gobbleaub, Uva Runkel A, MD    Family History History reviewed. No pertinent family history.  Social  History Social History   Tobacco Use  . Smoking status: Current Every Day Smoker    Types: Cigars  . Smokeless tobacco: Never Used  Substance Use Topics  . Alcohol use: No  . Drug use: No     Allergies   Nickel   Review of Systems Review of Systems  Constitutional: Negative.   HENT: Negative.   Respiratory: Negative.   Cardiovascular: Negative.   Gastrointestinal: Negative.   Musculoskeletal: Positive for neck pain.       There is severe pain that extends from all of the fingers of her right hand into the wrist and up the forearm.  This is a numb pain sensation that is a 9 out of 10 level.  Neurological: Positive for weakness and numbness. Negative for headaches.  Psychiatric/Behavioral: Negative.      Physical Exam Triage Vital Signs ED Triage Vitals [12/24/17 1021]  Enc Vitals Group     BP 118/70     Pulse Rate 78     Resp      Temp      Temp src      SpO2 99 %     Weight 215 lb (97.5 kg)     Height  Head Circumference      Peak Flow      Pain Score 9     Pain Loc      Pain Edu?      Excl. in GC?    No data found.  Updated Vital Signs BP 118/70 (BP Location: Left Arm)   Pulse 78   Wt 97.5 kg   LMP 12/24/2017   SpO2 99%   BMI 34.70 kg/m   Visual Acuity Right Eye Distance:   Left Eye Distance:   Bilateral Distance:    Right Eye Near:   Left Eye Near:    Bilateral Near:     Physical Exam  Constitutional: She is oriented to person, place, and time. She appears well-developed and well-nourished.  Patient was holding her right hand complaining of severe discomfort in her entire right arm.  Eyes: Pupils are equal, round, and reactive to light.  Neck:  There is discomfort with extension of the neck and turning of the neck to the right.  Cardiovascular: Normal rate.  Pulmonary/Chest: Effort normal.  Musculoskeletal:  There is no obvious swelling of the hand wrist or forearm.  Radial pulses 2+.  There is diminished grip strength and patient  states she has pain with flexion extension at the elbow flexion extension at the wrist and movement of the upper shoulder.  There is no rash present.  The extremity is warm to touch.  Neurological: She is alert and oriented to person, place, and time. She displays normal reflexes. No cranial nerve deficit.  Skin: Skin is warm and dry. No rash noted.     UC Treatments / Results  Labs (all labs ordered are listed, but only abnormal results are displayed) Labs Reviewed  POCT URINE PREGNANCY    EKG None  Radiology Dg Cervical Spine Complete  Result Date: 12/24/2017 CLINICAL DATA:  Right hand tingling and numbness over the last 3 weeks. EXAM: CERVICAL SPINE - COMPLETE 4+ VIEW COMPARISON:  CT 119 1,013 FINDINGS: No malalignment. No disc space narrowing. No facet arthropathy. No osteophytic encroachment upon the canal or foramina. IMPRESSION: Normal radiographs. Electronically Signed   By: Paulina Fusi M.D.   On: 12/24/2017 11:26   Dg Wrist Complete Right  Result Date: 12/24/2017 CLINICAL DATA:  27 year old female with right hand tingling and numbness for 3 weeks extending to right-side of neck. Denies injury. Initial encounter. EXAM: RIGHT WRIST - COMPLETE 3+ VIEW COMPARISON:  None. FINDINGS: There is no evidence of fracture or dislocation. Congenitally small ulnar styloid. There is no evidence of arthropathy or other focal bone abnormality. Soft tissues are unremarkable. IMPRESSION: Negative. Electronically Signed   By: Lacy Duverney M.D.   On: 12/24/2017 11:27    Procedures Procedures (including critical care time)  Medications Ordered in UC Medications - No data to display  Initial Impression / Assessment and Plan / UC Course  I have reviewed the triage vital signs and the nursing notes.  Pertinent labs & imaging results that were available during my care of the patient were reviewed by me and considered in my medical decision making (see chart for details).  This definitely seems to be  a nerve generated pain.  We will place the patient on Mobic 7.5 twice a day along with gabapentin and a short prescription of Ultram for severe pain.  I did pull her PMP aware sheet and she has been on no narcotics.  We will try and arrange appointment with sports medicine Ortho next week.  Final Clinical Impressions(s) / UC Diagnoses   Final diagnoses:  Right hand pain  Upper extremity pain, diffuse, right     Discharge Instructions     Take Mobic twice a day with food. You will have Ultram to take for breakthrough pain. Take gabapentin 100 mg 1-2 up to 3 times a day for pain. We will try and make an appointment for you to follow-up with orthopedics. Wear your sling and wrist splint as needed. Stay off of your birth control pills for now.    ED Prescriptions    Medication Sig Dispense Auth. Provider   meloxicam (MOBIC) 7.5 MG tablet Take 1 tablet twice a day with food. 20 tablet Collene Gobble, MD   traMADol (ULTRAM) 50 MG tablet Take 1 tablet (50 mg total) by mouth every 6 (six) hours as needed. 15 tablet Collene Gobble, MD   gabapentin (NEURONTIN) 100 MG capsule Take 1 to 2 capsules every 8 hours as needed for right arm pain 30 capsule Collene Gobble, MD     Controlled Substance Prescriptions [place] Controlled Substance Registry consulted? Yes, I have consulted the [place] Controlled Substances Registry for this patient, and feel the risk/benefit ratio today is favorable for proceeding with this prescription for a controlled substance.   Collene Gobble, MD 12/24/17 [phone]

## 2017-12-24 NOTE — ED Notes (Signed)
Apt scheduled with Bonnie Arnold for patient to see Dr. Denyse Amassorey for follow up 12/28/17 @ 830am. Wilmon ArmsArrive at 8:10am

## 2017-12-28 ENCOUNTER — Encounter: Payer: Self-pay | Admitting: Family Medicine

## 2017-12-28 ENCOUNTER — Ambulatory Visit (INDEPENDENT_AMBULATORY_CARE_PROVIDER_SITE_OTHER): Payer: Managed Care, Other (non HMO) | Admitting: Family Medicine

## 2017-12-28 VITALS — BP 124/77 | HR 63 | Ht 66.0 in | Wt 213.0 lb

## 2017-12-28 DIAGNOSIS — G5601 Carpal tunnel syndrome, right upper limb: Secondary | ICD-10-CM

## 2017-12-28 DIAGNOSIS — F5231 Female orgasmic disorder: Secondary | ICD-10-CM | POA: Insufficient documentation

## 2017-12-28 DIAGNOSIS — IMO0002 Reserved for concepts with insufficient information to code with codable children: Secondary | ICD-10-CM | POA: Insufficient documentation

## 2017-12-28 MED ORDER — PREDNISONE 50 MG PO TABS: 50.0000 mg | ORAL_TABLET | Freq: Every day | ORAL | 0 refills | Status: DC

## 2017-12-28 NOTE — Patient Instructions (Addendum)
Thank you for coming in today. I think this is carpal tunnel syndrome Use the brace especially at bedtime.  Take the prednisone.  If not making significant improvement by Wednesday let me know. I will work you in Thursday or Friday for an injection. Ok to return to work tomorrow with the brace.     Carpal Tunnel Syndrome Carpal tunnel syndrome is a condition that causes pain in your hand and arm. The carpal tunnel is a narrow area located on the palm side of your wrist. Repeated wrist motion or certain diseases may cause swelling within the tunnel. This swelling pinches the main nerve in the wrist (median nerve). What are the causes? This condition may be caused by:  Repeated wrist motions.  Wrist injuries.  Arthritis.  A cyst or tumor in the carpal tunnel.  Fluid buildup during pregnancy.  Sometimes the cause of this condition is not known. What increases the risk? This condition is more likely to develop in:  People who have jobs that cause them to repeatedly move their wrists in the same motion, such as Health visitorbutchers and cashiers.  Women.  People with certain conditions, such as: ? Diabetes. ? Obesity. ? An underactive thyroid (hypothyroidism). ? Kidney failure.  What are the signs or symptoms? Symptoms of this condition include:  A tingling feeling in your fingers, especially in your thumb, index, and middle fingers.  Tingling or numbness in your hand.  An aching feeling in your entire arm, especially when your wrist and elbow are bent for long periods of time.  Wrist pain that goes up your arm to your shoulder.  Pain that goes down into your palm or fingers.  A weak feeling in your hands. You may have trouble grabbing and holding items.  Your symptoms may feel worse during the night. How is this diagnosed? This condition is diagnosed with a medical history and physical exam. You may also have tests, including:  An electromyogram (EMG). This test measures  electrical signals sent by your nerves into the muscles.  X-rays.  How is this treated? Treatment for this condition includes:  Lifestyle changes. It is important to stop doing or modify the activity that caused your condition.  Physical or occupational therapy.  Medicines for pain and inflammation. This may include medicine that is injected into your wrist.  A wrist splint.  Surgery.  Follow these instructions at home: If you have a splint:  Wear it as told by your health care provider. Remove it only as told by your health care provider.  Loosen the splint if your fingers become numb and tingle, or if they turn cold and blue.  Keep the splint clean and dry. General instructions  Take over-the-counter and prescription medicines only as told by your health care provider.  Rest your wrist from any activity that may be causing your pain. If your condition is work related, talk to your employer about changes that can be made, such as getting a wrist pad to use while typing.  If directed, apply ice to the painful area: ? Put ice in a plastic bag. ? Place a towel between your skin and the bag. ? Leave the ice on for 20 minutes, 2-3 times per day.  Keep all follow-up visits as told by your health care provider. This is important.  Do any exercises as told by your health care provider, physical therapist, or occupational therapist. Contact a health care provider if:  You have new symptoms.  Your pain is  not controlled with medicines.  Your symptoms get worse. This information is not intended to replace advice given to you by your health care provider. Make sure you discuss any questions you have with your health care provider. Document Released: 05/02/2000 Document Revised: 09/13/2015 Document Reviewed: 09/20/2014 Elsevier Interactive Patient Education  [employer]2018 [employer]

## 2017-12-28 NOTE — Progress Notes (Signed)
Subjective:    I'm seeing this patient as a consultation for:  Dr Cleta Albertsaub  CC: Hand Pain  HPI: Ms. Bonnie Arnold notes an approximate 1 week history of pain in the right hand and wrist.  She notes pain located at the palmar hand into the thumb and first second and third digits.  She notes some pain refers up to the forearm and originally up to the shoulder.  She was seen in urgent care on August 8 where x-ray of her neck and wrist are unremarkable.  She was thought to perhaps have carpal tunnel syndrome and was prescribed meloxicam gabapentin and a wrist brace.  She notes that the wrist brace has been helpful.  She did not really take much of the gabapentin or meloxicam because she does not like taking medications.  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  She notes pain is worse in the morning after she wakes up.  She does note the pain is limiting her activity and is somewhat worse with activity.  She denies significant weakness.  No fevers or chills nausea vomiting or diarrhea.  She denies any injury or neck pain.  Past medical history, Surgical history, Family history not pertinant except as noted below, Social history, Allergies, and medications have been entered into the medical record, reviewed, and no changes needed.   Review of Systems: No headache, visual changes, nausea, vomiting, diarrhea, constipation, dizziness, abdominal pain, skin rash, fevers, chills, night sweats, weight loss, swollen lymph nodes, body aches, joint swelling, muscle aches, chest pain, shortness of breath, mood changes, visual or auditory hallucinations.   Objective:    Vitals:   12/28/17 0834  BP: 124/77  Pulse: 63   General: Well Developed, well nourished, and in no acute distress.  Neuro/Psych: Alert and oriented x3, extra-ocular muscles intact, able to move all 4 extremities, sensation grossly intact. Skin: Warm and dry, no rashes noted.  Respiratory: Not using  accessory muscles, speaking in full sentences, trachea midline.  Cardiovascular: Pulses palpable, no extremity edema. Abdomen: Does not appear distended. MSK:  C-spine: Nontender to spinal midline normal neck motion.  Negative Spurling's test.   Upper extremity strength reflexes and sensation are equal normal throughout bilaterally. Right wrist normal-appearing normal motion. Mildly tender to palpation volar wrist. Positive Tinel's and Phalen's test. Negative Finkelstein's test. Pulses capillary refill and sensation and strength are intact.   Lab and Radiology Results No results found for this or any previous visit (from the past 72 hour(s)). Dg Cervical Spine Complete  Result Date: 12/24/2017 CLINICAL DATA:  Right hand tingling and numbness over the last 3 weeks. EXAM: CERVICAL SPINE - COMPLETE 4+ VIEW COMPARISON:  CT 119 1,013 FINDINGS: No malalignment. No disc space narrowing. No facet arthropathy. No osteophytic encroachment upon the canal or foramina. IMPRESSION: Normal radiographs. Electronically Signed   By: Paulina FusiMark  Shogry M.D.   On: 12/24/2017 11:26   Dg Wrist Complete Right  Result Date: 12/24/2017 CLINICAL DATA:  27 year old female with right hand tingling and numbness for 3 weeks extending to right-side of neck. Denies injury. Initial encounter. EXAM: RIGHT WRIST - COMPLETE 3+ VIEW COMPARISON:  None. FINDINGS: There is no evidence of fracture or dislocation. Congenitally small ulnar styloid. There is no evidence of arthropathy or other focal bone abnormality. Soft tissues are unremarkable. IMPRESSION: Negative. Electronically Signed   By: Lacy DuverneySteven  Olson M.D.   On: 12/24/2017 11:27  I personally (independently) visualized  and performed the interpretation of the images attached in this note.   Impression and Recommendations:    Assessment and Plan: 27 y.o. female with  Wrist pain.  Very likely carpal tunnel syndrome.  Plan for trial of prednisone and gabapentin and cock-up wrist  splint.  If not improving rapidly next step would be ultrasound-guided median nerve hydrodissection.  Work note provided.  We will recheck this week if not improving.  Return sooner if needed.   No orders of the defined types were placed in this encounter.  Meds ordered this encounter  Medications  . predniSONE (DELTASONE) 50 MG tablet    Sig: Take 1 tablet (50 mg total) by mouth daily.    Dispense:  5 tablet    Refill:  0    Discussed warning signs or symptoms. Please see discharge instructions. Patient expresses understanding.

## 2018-01-07 ENCOUNTER — Telehealth: Payer: Self-pay | Admitting: General Practice

## 2018-01-07 NOTE — Telephone Encounter (Signed)
Tried calling patient back and message keeps saying the call will not go through. KG LPN

## 2018-01-07 NOTE — Telephone Encounter (Signed)
Schedule follow-up appointment with me in the near future for ultrasound-guided injection in the near future.

## 2018-01-07 NOTE — Telephone Encounter (Signed)
Pt called wondering why she hadn't gotten a call back. I told her we did call her back.. And read the message to her but she insisted that we could've called her.  I also read Dr Zollie Peeorey's message.  She says she needs a work note stating that she can only do "light duty"

## 2018-01-07 NOTE — Telephone Encounter (Signed)
Patient calls and not any better. Went back to work on Monday and her whole arm is in pain. So took a few days off. I looked in last office note as to next step. Do you want to proceed to the next step. Please advise. KG LPN

## 2018-01-08 ENCOUNTER — Encounter: Payer: Self-pay | Admitting: Family Medicine

## 2018-01-08 NOTE — Telephone Encounter (Signed)
LM on [employer] work note ready for pick up and to schedule appointment when picks up. KG LPN

## 2018-01-08 NOTE — Telephone Encounter (Signed)
Work note written.  Please inform patient is ready for pickup.  Please confirm patient has scheduled an appointment like we are trying to arrange.

## 2018-01-15 ENCOUNTER — Encounter: Payer: Self-pay | Admitting: Family Medicine

## 2018-01-15 ENCOUNTER — Ambulatory Visit (INDEPENDENT_AMBULATORY_CARE_PROVIDER_SITE_OTHER): Payer: Managed Care, Other (non HMO) | Admitting: Family Medicine

## 2018-01-15 VITALS — BP 123/77 | HR 56 | Wt 215.0 lb

## 2018-01-15 DIAGNOSIS — G5621 Lesion of ulnar nerve, right upper limb: Secondary | ICD-10-CM

## 2018-01-15 DIAGNOSIS — G5601 Carpal tunnel syndrome, right upper limb: Secondary | ICD-10-CM

## 2018-01-15 MED ORDER — PREDNISONE 5 MG (48) PO TBPK: ORAL_TABLET | ORAL | 0 refills | Status: DC

## 2018-01-15 MED ORDER — AMITRIPTYLINE HCL 25 MG PO TABS: 25.0000 mg | ORAL_TABLET | Freq: Every day | ORAL | 0 refills | Status: DC

## 2018-01-15 NOTE — Progress Notes (Signed)
Bonnie Arnold is a 27 y.o. female who presents to [name] today for follow-up right hand pain. Seidy was seen on August 12 for right hand pain.  Symptoms started earlier this month.  She notes pain extending into her hand associate with numbness and tingling.  She was seen in urgent care on August 8 x-rays of her cervical spine were unremarkable and x-rays of her right wrist was also normal.  In clinic symptoms were predominantly in her first 3 digits of her right hand she was thought to have carpal tunnel syndrome.  We discussed options and she declined injection at that time.  We proceed with a trial of wrist brace and oral steroids.  Additionally she was prescribed gabapentin.  She notes that she completed the oral steroids which helped only a little.  She was intolerant of gabapentin.  In the interim she notes considerable pain into her hand also having some pain into the fourth and fifth digits.  Additionally she notes pain into her medial elbow.  Symptoms are bad enough that she is having trouble working.  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   ROS as above:  Exam:  BP 123/77   Pulse (!) 56   Wt 215 lb (97.5 kg)   LMP 12/24/2017   BMI 34.70 kg/m  Wt Readings from Last 5 Encounters:  01/15/18 215 lb (97.5 kg)  12/28/17 213 lb (96.6 kg)  12/24/17 215 lb (97.5 kg)  11/11/17 214 lb (97.1 kg)  02/20/17 189 lb (85.7 kg)    Gen: Well NAD HEENT: EOMI,  MMM Lungs: Normal work of breathing. CTABL Heart: RRR no MRG Abd: NABS, Soft. Nondistended, Nontender Exts: Brisk capillary refill, warm and well perfused.  C-spine: Nontender. Normal motion. Upper extremity strength is intact. Right shoulder normal-appearing nontender normal motion. Right elbow: Normal-appearing nontender normal motion negative Tinel's over cubital  tunnel. Right wrist normal-appearing normal motion. Mildly tender to palpation across the palmar wrist  Normal grip strength.  Pulses capillary refill and sensation are intact.  Lab and Radiology Results EXAM: CERVICAL SPINE - COMPLETE 4+ VIEW  COMPARISON:  CT 119 1,013  FINDINGS: No malalignment. No disc space narrowing. No facet arthropathy. No osteophytic encroachment upon the canal or foramina.  IMPRESSION: Normal radiographs.   Electronically Signed   By: Paulina Fusi M.D.   On: 12/24/2017 11:26 I personally (independently) visualized and performed the interpretation of the images attached in this note.    Assessment and Plan: 27 y.o. female with persistent hand and arm pain and paresthesias.  Symptoms consistent with carpal tunnel syndrome and possibly cubital tunnel syndrome.  Failed to improve with simple early conservative management.  Patient reluctant to proceed with injection today.  Plan to proceed with nerve conduction study.  Additionally will prescribe a longer course of prednisone as she had some benefit with a short course.  Additionally she is intolerant of gabapentin will use a trial of amitriptyline at bedtime.  Recheck if not improving. Unclear etiology worsening.  Orders Placed This Encounter  Procedures  . NCV with EMG(electromyography)    Standing Status:   Future    Standing Expiration Date:   01/16/2019    Order Specific Question:   Where should this test be performed?    Answer:   [facility]   Meds ordered this encounter  Medications  . predniSONE (STERAPRED UNI-PAK 48 TAB) 5  MG (48) TBPK tablet    Sig: 12 day dosepack po    Dispense:  48 tablet    Refill:  0  . amitriptyline (ELAVIL) 25 MG tablet    Sig: Take 1-2 tablets (25-50 mg total) by mouth at bedtime.    Dispense:  60 tablet    Refill:  0     Historical information moved to improve visibility of documentation.  Past Medical History:  Diagnosis Date  . Thyroid disease    No past  surgical history on file. Social History   Tobacco Use  . Smoking status: Current Every Day Smoker    Types: Cigars  . Smokeless tobacco: Never Used  Substance Use Topics  . Alcohol use: No   family history includes Cancer in her maternal aunt and maternal grandmother.  Medications: Current Outpatient Medications  Medication Sig Dispense Refill  . Norethin Ace-Eth Estrad-FE (LARIN 24 FE PO) Take by mouth.    Marland Kitchen. amitriptyline (ELAVIL) 25 MG tablet Take 1-2 tablets (25-50 mg total) by mouth at bedtime. 60 tablet 0  . predniSONE (STERAPRED UNI-PAK 48 TAB) 5 MG (48) TBPK tablet 12 day dosepack po 48 tablet 0   No current facility-administered medications for this visit.    Allergies  Allergen Reactions  . Nickel      Discussed warning signs or symptoms. Please see discharge instructions. Patient expresses understanding.

## 2018-01-15 NOTE — Patient Instructions (Addendum)
Thank you for coming in today.  You should hear about the nerve study You can also call [facility] [phone]   Use amitriptyline at bedtime   Take longer course of prednisone.   Keep me updated.

## 2018-01-19 ENCOUNTER — Telehealth: Payer: Self-pay | Admitting: Family Medicine

## 2018-01-19 ENCOUNTER — Encounter: Payer: Self-pay | Admitting: Family Medicine

## 2018-01-19 NOTE — Telephone Encounter (Signed)
Letter written to return to work tomorrow with no restrictions.  Letter ready for pickup or patient can let us know where we need to fax it.

## 2018-01-19 NOTE — Telephone Encounter (Signed)
Patient called this afternoon stating that she needs a note releasing her to go back to work.

## 2018-01-20 NOTE — Telephone Encounter (Signed)
Left pt msg that letter is ready to be picked up

## 2018-01-20 NOTE — Telephone Encounter (Signed)
Also left office call back information in case she needs letter faxed

## 2018-02-11 ENCOUNTER — Encounter: Payer: Managed Care, Other (non HMO) | Admitting: Diagnostic Neuroimaging

## 2018-02-15 ENCOUNTER — Encounter: Payer: Self-pay | Admitting: Diagnostic Neuroimaging

## 2018-12-21 ENCOUNTER — Encounter (HOSPITAL_COMMUNITY): Payer: Self-pay | Admitting: Emergency Medicine

## 2018-12-21 ENCOUNTER — Ambulatory Visit (HOSPITAL_COMMUNITY)
Admission: EM | Admit: 2018-12-21 | Discharge: 2018-12-21 | Disposition: A | Discharge status: Home / Self Care | Payer: Managed Care, Other (non HMO) | Attending: Internal Medicine | Admitting: Internal Medicine

## 2018-12-21 DIAGNOSIS — M545 Low back pain, unspecified: Secondary | ICD-10-CM

## 2018-12-21 DIAGNOSIS — Z3202 Encounter for pregnancy test, result negative: Secondary | ICD-10-CM

## 2018-12-21 LAB — POCT URINALYSIS DIP (DEVICE)
Glucose, UA: NEGATIVE mg/dL
Ketones, ur: NEGATIVE mg/dL
Leukocytes,Ua: NEGATIVE
Nitrite: NEGATIVE
Protein, ur: NEGATIVE mg/dL
Specific Gravity, Urine: >=1.03 (ref 1.005–1.030)
Urobilinogen, UA: 0.2 mg/dL (ref 0.0–1.0)
pH: 5.5 (ref 5.0–8.0)

## 2018-12-21 MED ORDER — IBUPROFEN 600 MG PO TABS: 600.0000 mg | ORAL_TABLET | Freq: Four times a day (QID) | ORAL | 0 refills | Status: DC | PRN

## 2018-12-21 NOTE — ED Triage Notes (Signed)
Pt states for the last few days shes had lower abdominal pain and cramping, back pain, unsure if UTI or BV

## 2018-12-21 NOTE — ED Provider Notes (Signed)
[facility]    CSN: [id number] Arrival date & time: 12/21/18  1938     History   Chief Complaint Chief Complaint  Patient presents with  . Pelvic Pain    HPI Bonnie Arnold is a 28 y.o. female comes to urgent care with complaints of lower back pain and lower abdominal pain of a few days duration.  Patient says symptoms started 3 days ago and is gotten progressively worse.  Abdominal pain is cramping in nature.  No known aggravating or relieving factors.  She has some dysuria but no urgency or frequency.  No change in vaginal discharge.  No foul-smelling discharge.  Patient denies any deep dyspareunia. HPI  Past Medical History:  Diagnosis Date  . Thyroid disease     Patient Active Problem List   Diagnosis Date Noted  . 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 Carpal tunnel syndrome on right 12/28/2017  . Irregular menses 10/14/2017  . Convulsions (HCC) 10/23/2012  . Disorder of menstrual bleeding 10/23/2012  . Perennial allergic rhinitis with seasonal variation 10/23/2012    History reviewed. No pertinent surgical history.  OB History   No obstetric history on file.      Home Medications    Prior to Admission medications   Medication Sig Start Date End Date Taking? Authorizing Provider  ibuprofen (ADVIL) 600 MG tablet Take 1 tablet (600 mg total) by mouth every 6 (six) hours as needed. 12/21/18   Kristalynn Coddington, Britta MccreedyPhilip O, MD  metroNIDAZOLE (FLAGYL) 500 MG tablet Take 1 tablet (500 mg total) by mouth 2 (two) times daily for 7 days. 12/27/18 01/03/19  Eustace MooreNelson, Yvonne Sue, MD  Norethin Ace-Eth Estrad-FE (LARIN 24 FE PO) Take by mouth.    [provider]  predniSONE (STERAPRED UNI-PAK 48 TAB) 5 MG (48) TBPK tablet 12 day dosepack po 01/15/18   Rodolph Bongorey, Evan S, MD  amitriptyline (ELAVIL) 25 MG tablet Take 1-2 tablets (25-50 mg total) by mouth at bedtime. Patient not taking: Reported on 12/21/2018 01/15/18 12/21/18  Rodolph Bongorey, Evan S, MD    Family History Family History  Problem  Relation Age of Onset  . Cancer Maternal Aunt   . Cancer Maternal Grandmother     Social History Social History   Tobacco Use  . Smoking status: Current Every Day Smoker    Types: Cigars  . Smokeless tobacco: Never Used  Substance Use Topics  . Alcohol use: No  . Drug use: No     Allergies   Nickel   Review of Systems Review of Systems  Constitutional: Negative for activity change, diaphoresis and fever.  HENT: Negative.   Respiratory: Negative.   Cardiovascular: Negative.   Gastrointestinal: Positive for abdominal pain.  Genitourinary: Positive for dysuria. Negative for frequency, urgency, vaginal bleeding, vaginal discharge and vaginal pain.  Musculoskeletal: Positive for back pain. Negative for arthralgias, gait problem and myalgias.  Skin: Negative.   Neurological: Negative for dizziness, weakness, light-headedness and headaches.     Physical Exam Triage Vital Signs ED Triage Vitals  Enc Vitals Group     BP      Pulse      Resp      Temp      Temp src      SpO2      Weight      Height      Head Circumference      Peak Flow      Pain Score      Pain Loc  Pain Edu?      Excl. in [place]?    No data found.  Updated Vital Signs BP 119/87   Pulse 65   Temp 98 F (36.7 C)   Resp 16   LMP 12/14/2018   SpO2 99%   Visual Acuity Right Eye Distance:   Left Eye Distance:   Bilateral Distance:    Right Eye Near:   Left Eye Near:    Bilateral Near:     Physical Exam Constitutional:      Appearance: She is not ill-appearing or toxic-appearing.  Cardiovascular:     Rate and Rhythm: Normal rate and regular rhythm.     Pulses: Normal pulses.     Heart sounds: Normal heart sounds. No murmur. No gallop.   Pulmonary:     Effort: Pulmonary effort is normal. No respiratory distress.     Breath sounds: Normal breath sounds. No wheezing.  Abdominal:     General: Bowel sounds are normal. There is no distension.     Palpations: Abdomen is soft.      Tenderness: There is no abdominal tenderness. There is no guarding.  Musculoskeletal: Normal range of motion.        General: No swelling, deformity or signs of injury.  Skin:    General: Skin is warm.     Capillary Refill: Capillary refill takes less than 2 seconds.  Neurological:     General: No focal deficit present.     Mental Status: She is alert and oriented to person, place, and time.      UC Treatments / Results  Labs (all labs ordered are listed, but only abnormal results are displayed) Labs Reviewed  POCT URINALYSIS DIP (DEVICE) - Abnormal; Notable for the following components:      Result Value   Bilirubin Urine SMALL (*)    Hgb urine dipstick TRACE (*)    All other components within normal limits  CERVICOVAGINAL ANCILLARY ONLY - Abnormal; Notable for the following components:   Bacterial vaginitis **POSITIVE for Megasphaera 1** (*)    All other components within normal limits  POC URINE PREG, ED  POCT PREGNANCY, URINE    EKG   Radiology No results found.  Procedures Procedures (including critical care time)  Medications Ordered in UC Medications - No data to display  Initial Impression / Assessment and Plan / UC Course  I have reviewed the triage vital signs and the nursing notes.  Pertinent labs & imaging results that were available during my care of the patient were reviewed by me and considered in my medical decision making (see chart for details).     1.  Acute lower back pain without sciatica: Cold compress Ibuprofen 600 mg every 6 hours as needed Gentle stretching exercises If patient develops any numbness in the pelvic area, urinary or bowel problems or lower extremity weakness or numbness she needs to return to urgent care to be reevaluated. Final Clinical Impressions(s) / UC Diagnoses   Final diagnoses:  Acute bilateral low back pain without sciatica   Discharge Instructions   None    ED Prescriptions    Medication Sig Dispense Auth.  Provider   ibuprofen (ADVIL) 600 MG tablet Take 1 tablet (600 mg total) by mouth every 6 (six) hours as needed. 30 tablet Whisper Kurka, Myrene Galas, MD     Controlled Substance Prescriptions [place] Controlled Substance Registry consulted? No   Chase Picket, MD 12/27/18 1728

## 2018-12-21 NOTE — ED Notes (Signed)
Pregnancy test result-Negative 

## 2018-12-22 LAB — POCT PREGNANCY, URINE: Preg Test, Ur: NEGATIVE

## 2018-12-25 LAB — CERVICOVAGINAL ANCILLARY ONLY
Bacterial vaginitis: POSITIVE — AB
Chlamydia: NEGATIVE
Neisseria Gonorrhea: NEGATIVE
Trichomonas: NEGATIVE

## 2018-12-27 ENCOUNTER — Telehealth (HOSPITAL_COMMUNITY): Payer: Self-pay | Admitting: Emergency Medicine

## 2018-12-27 MED ORDER — METRONIDAZOLE 500 MG PO TABS: 500.0000 mg | ORAL_TABLET | Freq: Two times a day (BID) | ORAL | 0 refills | Status: DC

## 2018-12-27 NOTE — Telephone Encounter (Signed)
Bacterial vaginosis is positive. This was not treated at the urgent care visit.  Flagyl 500 mg BID x 7 days #14 no refills sent to patients pharmacy of choice.    Attempted to reach patient. No answer at this time. Voicemail left.    

## 2018-12-28 ENCOUNTER — Telehealth (HOSPITAL_COMMUNITY): Payer: Self-pay | Admitting: Emergency Medicine

## 2018-12-28 MED ORDER — METRONIDAZOLE 500 MG PO TABS: 500.0000 mg | ORAL_TABLET | Freq: Two times a day (BID) | ORAL | 0 refills | Status: AC

## 2018-12-28 NOTE — Telephone Encounter (Signed)
Patient contacted and made aware of  swab  results, all questions answered. Medicine sent to preferred pharmacy.

## 2019-09-08 ENCOUNTER — Emergency Department (INDEPENDENT_AMBULATORY_CARE_PROVIDER_SITE_OTHER): Payer: Self-pay

## 2019-09-08 ENCOUNTER — Other Ambulatory Visit: Payer: Self-pay

## 2019-09-08 ENCOUNTER — Ambulatory Visit
Admission: RE | Admit: 2019-09-08 | Discharge: 2019-09-08 | Disposition: A | Discharge status: Home / Self Care | Payer: Self-pay | Source: Ambulatory Visit

## 2019-09-08 ENCOUNTER — Other Ambulatory Visit (HOSPITAL_COMMUNITY)
Admission: RE | Admit: 2019-09-08 | Discharge: 2019-09-08 | Disposition: A | Discharge status: Home / Self Care | Payer: Self-pay | Source: Ambulatory Visit | Attending: Family Medicine | Admitting: Family Medicine

## 2019-09-08 ENCOUNTER — Emergency Department (INDEPENDENT_AMBULATORY_CARE_PROVIDER_SITE_OTHER)
Admission: EM | Admit: 2019-09-08 | Discharge: 2019-09-08 | Disposition: A | Discharge status: Home / Self Care | Payer: Self-pay | Source: Home / Self Care

## 2019-09-08 DIAGNOSIS — R102 Pelvic and perineal pain: Secondary | ICD-10-CM | POA: Insufficient documentation

## 2019-09-08 DIAGNOSIS — Q501 Developmental ovarian cyst: Secondary | ICD-10-CM

## 2019-09-08 LAB — POCT URINALYSIS DIP (MANUAL ENTRY)
Bilirubin, UA: NEGATIVE
Blood, UA: NEGATIVE
Glucose, UA: NEGATIVE mg/dL
Ketones, POC UA: NEGATIVE mg/dL
Leukocytes, UA: NEGATIVE
Nitrite, UA: NEGATIVE
Protein Ur, POC: NEGATIVE mg/dL
Spec Grav, UA: >=1.03 — AB (ref 1.010–1.025)
Urobilinogen, UA: 0.2 E.U./dL
pH, UA: 5 (ref 5.0–8.0)

## 2019-09-08 MED ORDER — ACETAMINOPHEN 325 MG PO TABS: 975.0000 mg | ORAL_TABLET | Freq: Once | ORAL | Status: DC

## 2019-09-08 MED ORDER — ACETAMINOPHEN 325 MG PO TABS
650.0000 mg | ORAL_TABLET | Freq: Once | ORAL | Status: AC
  Administered 2019-09-08: 650 mg via ORAL

## 2019-09-08 NOTE — ED Provider Notes (Signed)
Recommending further evaluation and management in person for vaginal pain and abdominal pain.  Patient aware and in agreement to plan.  Will go to [facility] for further evaluation and management.     Rennis Harding, PA-C 09/08/19 1020

## 2019-09-08 NOTE — ED Triage Notes (Signed)
Adb cramps/pelvic pain since Monday. Tues explains as pelvic pressure as well as lower back pain. Pain 8/10 Tried OTC UTI relief with no improvement.

## 2019-09-08 NOTE — Discharge Instructions (Signed)
°  Your imaging today is consistent with multiple Right side ovarian cysts. A non-emergent MRI is recommended for further evaluation of these cysts.  Please call to schedule an appointment with a gynecologist for further evaluation and to discuss treatment options.

## 2019-09-08 NOTE — ED Provider Notes (Signed)
[facility]    CSN: [id number] Arrival date & time: 09/08/19  1302      History   Chief Complaint No chief complaint on file.   HPI Bonnie Arnold is a 29 y.o. female.   HPI  Bonnie Arnold is a 29 y.o. female presenting to UC with c/o 3 days of gradually worsening pelvic pain, cramping, and pressure, associated low back pain. Pain is 8/10.  She is concerned she may have a UTI. Denies vaginal bleeding or discharge. Hx of ovarian cysts in the past but they have never been an issue. Denies hx of abdominal surgeries. Low concern for STIs but has had BV in the past.  LMP: 08/18/2019   Past Medical History:  Diagnosis Date  . Thyroid disease     Patient Active Problem List   Diagnosis Date Noted  . 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 Carpal tunnel syndrome on right 12/28/2017  . Irregular menses 10/14/2017  . Convulsions (HCC) 10/23/2012  . Disorder of menstrual bleeding 10/23/2012  . Perennial allergic rhinitis with seasonal variation 10/23/2012    History reviewed. No pertinent surgical history.  OB History   No obstetric history on file.      Home Medications    Prior to Admission medications   Medication Sig Start Date End Date Taking? Authorizing Provider  ibuprofen (ADVIL) 600 MG tablet Take 1 tablet (600 mg total) by mouth every 6 (six) hours as needed. 12/21/18   Lamptey, Britta Mccreedy, MD  Norethin Ace-Eth Estrad-FE (LARIN 24 FE PO) Take by mouth.    [provider]  predniSONE (STERAPRED UNI-PAK 48 TAB) 5 MG (48) TBPK tablet 12 day dosepack po 01/15/18   Rodolph Bong, MD  amitriptyline (ELAVIL) 25 MG tablet Take 1-2 tablets (25-50 mg total) by mouth at bedtime. Patient not taking: Reported on 12/21/2018 01/15/18 12/21/18  Rodolph Bong, MD    Family History Family History  Problem Relation Age of Onset  . Cancer Maternal Aunt   . Cancer Maternal Grandmother     Social History Social History   Tobacco Use  . Smoking status: Current Every  Day Smoker    Types: Cigars  . Smokeless tobacco: Never Used  Substance Use Topics  . Alcohol use: No  . Drug use: No     Allergies   Nickel   Review of Systems Review of Systems  Constitutional: Negative for chills and fever.  Gastrointestinal: Positive for abdominal pain. Negative for diarrhea, nausea and vomiting.  Genitourinary: Positive for decreased urine volume, pelvic pain and vaginal pain. Negative for dysuria, frequency, hematuria, urgency, vaginal bleeding and vaginal discharge.  Musculoskeletal: Positive for back pain.  Neurological: Negative for dizziness and headaches.     Physical Exam Triage Vital Signs ED Triage Vitals  Enc Vitals Group     BP 09/08/19 1314 130/77     Pulse Rate 09/08/19 1314 74     Resp 09/08/19 1314 18     Temp 09/08/19 1314 98.3 F (36.8 C)     Temp Source 09/08/19 1314 Oral     SpO2 09/08/19 1314 100 %     Weight --      Height --      Head Circumference --      Peak Flow --      Pain Score 09/08/19 1317 8     Pain Loc --      Pain Edu? --      Excl. in GC? --  No data found.  Updated Vital Signs BP 130/77 (BP Location: Left Arm)   Pulse 74   Temp 98.3 F (36.8 C) (Oral)   Resp 18   LMP 08/18/2019 (Exact Date)   SpO2 100%   Visual Acuity Right Eye Distance:   Left Eye Distance:   Bilateral Distance:    Right Eye Near:   Left Eye Near:    Bilateral Near:     Physical Exam Vitals and nursing note reviewed. Exam conducted with a chaperone present.  Constitutional:      Appearance: Normal appearance. She is well-developed.  HENT:     Head: Normocephalic and atraumatic.     Mouth/Throat:     Mouth: Mucous membranes are moist.  Cardiovascular:     Rate and Rhythm: Normal rate and regular rhythm.  Pulmonary:     Effort: Pulmonary effort is normal.     Breath sounds: Normal breath sounds.  Abdominal:     Palpations: Abdomen is soft.     Tenderness: There is abdominal tenderness in the suprapubic area. There  is no right CVA tenderness, left CVA tenderness, guarding or rebound. Negative signs include Murphy's sign and McBurney's sign.  Genitourinary:    Labia:        Right: No rash, tenderness or lesion.        Left: No rash, tenderness or lesion.      Vagina: Vaginal discharge (scant thin white) present. No bleeding or prolapsed vaginal walls.     Cervix: Normal.  Musculoskeletal:        General: Normal range of motion.     Cervical back: Normal range of motion.  Skin:    General: Skin is warm and dry.  Neurological:     Mental Status: She is alert and oriented to person, place, and time.  Psychiatric:        Behavior: Behavior normal.      UC Treatments / Results  Labs (all labs ordered are listed, but only abnormal results are displayed) Labs Reviewed  POCT URINALYSIS DIP (MANUAL ENTRY) - Abnormal; Notable for the following components:      Result Value   Spec Grav, UA >=1.030 (*)    All other components within normal limits  CERVICOVAGINAL ANCILLARY ONLY    EKG   Radiology US PELVIC COMPLETE W TRANSVAGINAL AND TORSION R/O  Result Date: 09/08/2019 CLINICAL DATA:  29 year old presenting with acute onset of pelvic pain that began 4 days ago. LMP 08/18/2019. G0P0. EXAM: TRANSABDOMINAL AND TRANSVAGINAL ULTRASOUND OF PELVIS DOPPLER ULTRASOUND OF OVARIES TECHNIQUE: Both transabdominal and transvaginal ultrasound examinations of the pelvis were performed. Transabdominal technique was performed for global imaging of the pelvis including uterus, ovaries, adnexal regions, and pelvic cul-de-sac. It was necessary to proceed with endovaginal exam following the transabdominal exam to visualize the endometrium and ovaries as the bladder was incompletely distended. Color and duplex Doppler ultrasound was utilized to evaluate blood flow to the ovaries. COMPARISON:  CT abdomen and pelvis 02/21/2017. FINDINGS: Uterus Measurements: Approximately 7.3 x 4.8 x 6.1 cm = volume: 112 mL. Retroverted.  Homogeneous echotexture without focal fibroid or other myometrial abnormality. Normal-appearing uterine cervix. Endometrium Thickness: Approximately 13 mm. Normal appearance without evidence of endometrial fluid or mass. Right ovary Measurements: Approximately 7.2 x 3.5 x 4.9 cm = volume: 65 mL. Numerous cysts involving the RIGHT ovary, including a complicated cyst (favored over a solid mass as there is no internal color doppler flow). The complicated cyst measures approximately 3.4 x 2.3 x  3.3 cm. The largest cyst measures approximately 4.6 x 2.4 x 3.4 cm, containing internal septations and/or daughter cysts. Normal color doppler flow is present within the ovary. Left ovary Measurements: Approximately 3.8 x 1.7 x 2.0 cm = volume: 7 mL. Small follicular cysts. No dominant cyst or solid mass. Normal color Doppler flow within the ovary. Pulsed Doppler evaluation of both ovaries demonstrates normal low-resistance arterial and venous waveforms. Other findings Trace free pelvic fluid. IMPRESSION: 1. Numerous RIGHT ovarian cysts including a likely hemorrhagic cyst (favored over a solid mass as there is no internal blood flow), the largest measured above. I believe these are more likely to represent separate cysts rather than one large multicystic mass. Further imaging with non-emergent MRI of the pelvis may be confirmatory. 2. Normal appearing uterus and LEFT ovary. 3. Trace, likely physiologic free pelvic fluid. Electronically Signed   By: Evangeline Dakin M.D.   On: 09/08/2019 15:17    Procedures Procedures (including critical care time)  Medications Ordered in UC Medications  acetaminophen (TYLENOL) tablet 650 mg (650 mg Oral Given 09/08/19 1343)    Initial Impression / Assessment and Plan / UC Course  I have reviewed the triage vital signs and the nursing notes.  Pertinent labs & imaging results that were available during my care of the patient were reviewed by me and considered in my medical decision  making (see chart for details).     Discussed imaging with pt Encouraged f/u with GYN for further evaluation of suspected multiple ovarian cysts.  AVS provided  Final Clinical Impressions(s) / UC Diagnoses   Final diagnoses:  Acute pelvic pain, female  Multiple developmental ovarian cysts     Discharge Instructions      Your imaging today is consistent with multiple Right side ovarian cysts. A non-emergent MRI is recommended for further evaluation of these cysts.  Please call to schedule an appointment with a gynecologist for further evaluation and to discuss treatment options.    ED Prescriptions    None     PDMP not reviewed this encounter.   Noe Gens, [place] 09/08/19 [phone]

## 2019-09-09 LAB — CERVICOVAGINAL ANCILLARY ONLY
Bacterial Vaginitis (gardnerella): NEGATIVE
Candida Glabrata: NEGATIVE
Candida Vaginitis: NEGATIVE
Chlamydia: NEGATIVE
Comment: NEGATIVE
Comment: NEGATIVE
Comment: NEGATIVE
Comment: NEGATIVE
Comment: NEGATIVE
Comment: NORMAL
Neisseria Gonorrhea: NEGATIVE
Trichomonas: NEGATIVE

## 2019-09-10 ENCOUNTER — Telehealth: Payer: Self-pay

## 2019-10-12 ENCOUNTER — Emergency Department (HOSPITAL_BASED_OUTPATIENT_CLINIC_OR_DEPARTMENT_OTHER)
Admission: EM | Admit: 2019-10-12 | Discharge: 2019-10-12 | Disposition: A | Discharge status: Home / Self Care | Payer: Self-pay | Attending: Emergency Medicine | Admitting: Emergency Medicine

## 2019-10-12 ENCOUNTER — Encounter (HOSPITAL_BASED_OUTPATIENT_CLINIC_OR_DEPARTMENT_OTHER): Payer: Self-pay | Admitting: Emergency Medicine

## 2019-10-12 ENCOUNTER — Other Ambulatory Visit: Payer: Self-pay

## 2019-10-12 DIAGNOSIS — M792 Neuralgia and neuritis, unspecified: Secondary | ICD-10-CM | POA: Insufficient documentation

## 2019-10-12 DIAGNOSIS — Z79899 Other long term (current) drug therapy: Secondary | ICD-10-CM | POA: Insufficient documentation

## 2019-10-12 DIAGNOSIS — Z87891 Personal history of nicotine dependence: Secondary | ICD-10-CM | POA: Insufficient documentation

## 2019-10-12 HISTORY — DX: Unspecified ovarian cyst, unspecified side: N83.209

## 2019-10-12 MED ORDER — GABAPENTIN 100 MG PO CAPS: 100.0000 mg | ORAL_CAPSULE | Freq: Three times a day (TID) | ORAL | 0 refills | Status: DC
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
## 2019-10-12 NOTE — Discharge Instructions (Signed)
If you notice weakness or numbness to your arm, new or worsening pain, or any other new/concerning symptoms then return to the ER for evaluation.
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
## 2019-10-12 NOTE — ED Notes (Signed)
ED Provider at bedside. 

## 2020-02-07 ENCOUNTER — Other Ambulatory Visit (HOSPITAL_COMMUNITY)
Admission: RE | Admit: 2020-02-07 | Discharge: 2020-02-07 | Disposition: A | Discharge status: Home / Self Care | Payer: Self-pay | Source: Ambulatory Visit | Attending: Family Medicine | Admitting: Family Medicine

## 2020-02-07 ENCOUNTER — Emergency Department: Admit: 2020-02-07 | Discharge status: Absent without leave | Payer: Self-pay

## 2020-02-07 ENCOUNTER — Emergency Department (INDEPENDENT_AMBULATORY_CARE_PROVIDER_SITE_OTHER)
Admission: EM | Admit: 2020-02-07 | Discharge: 2020-02-07 | Disposition: A | Discharge status: Home / Self Care | Payer: Self-pay | Source: Home / Self Care | Attending: Family Medicine | Admitting: Family Medicine

## 2020-02-07 ENCOUNTER — Telehealth: Payer: Self-pay | Admitting: Emergency Medicine

## 2020-02-07 ENCOUNTER — Other Ambulatory Visit: Payer: Self-pay

## 2020-02-07 DIAGNOSIS — R319 Hematuria, unspecified: Secondary | ICD-10-CM

## 2020-02-07 DIAGNOSIS — R350 Frequency of micturition: Secondary | ICD-10-CM

## 2020-02-07 DIAGNOSIS — Z113 Encounter for screening for infections with a predominantly sexual mode of transmission: Secondary | ICD-10-CM | POA: Insufficient documentation

## 2020-02-07 DIAGNOSIS — N898 Other specified noninflammatory disorders of vagina: Secondary | ICD-10-CM

## 2020-02-07 LAB — POCT URINALYSIS DIP (MANUAL ENTRY)
Bilirubin, UA: NEGATIVE
Glucose, UA: NEGATIVE mg/dL
Ketones, POC UA: NEGATIVE mg/dL
Leukocytes, UA: NEGATIVE
Nitrite, UA: NEGATIVE
Protein Ur, POC: NEGATIVE mg/dL
Spec Grav, UA: 1.025 (ref 1.010–1.025)
Urobilinogen, UA: 0.2 E.U./dL
pH, UA: 5.5 (ref 5.0–8.0)

## 2020-02-07 NOTE — ED Provider Notes (Signed)
[name]    CSN: [id number] Arrival date & time: 02/07/20  1340      History   Chief Complaint Chief Complaint  Patient presents with  . Urinary Tract Infection    HPI Bonnie Arnold is a 29 y.o. female.   Patient complains of urinary frequency and urgency, without dysuria, and mild lower back ache for two weeks.  She denies abdominal/pelvic pain.  She requests testing for yeast/STD.  Patient's last menstrual period was 01/30/2020.   She notes that she has a family history of diabetes (mother) and wonders if her frequency could represent diabetes.  The history is provided by the patient.    Past Medical History:  Diagnosis Date  . Ovarian cyst   . Thyroid disease     Patient Active Problem List   Diagnosis Date Noted  . 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 Carpal tunnel syndrome on right 12/28/2017  . Irregular menses 10/14/2017  . Convulsions (HCC) 10/23/2012  . Disorder of menstrual bleeding 10/23/2012  . Perennial allergic rhinitis with seasonal variation 10/23/2012    Past Surgical History:  Procedure Laterality Date  . WISDOM TOOTH EXTRACTION      OB History   No obstetric history on file.      Home Medications    Prior to Admission medications   Medication Sig Start Date End Date Taking? Authorizing Provider  gabapentin (NEURONTIN) 100 MG capsule Take 1 capsule (100 mg total) by mouth 3 (three) times daily. 10/12/19   Pricilla Loveless, MD  amitriptyline (ELAVIL) 25 MG tablet Take 1-2 tablets (25-50 mg total) by mouth at bedtime. Patient not taking: Reported on 12/21/2018 01/15/18 12/21/18  Rodolph Bong, MD    Family History Family History  Problem Relation Age of Onset  . Cancer Maternal Aunt   . Cancer Maternal Grandmother     Social History Social History   Tobacco Use  . Smoking status: Former Smoker    Types: Cigars  . Smokeless tobacco: Never Used  Vaping Use  . Vaping Use: Never used  Substance Use Topics  . Alcohol use:  No  . Drug use: No     Allergies   Nickel   Review of Systems Review of Systems  Constitutional: Negative for activity change, appetite change, chills, diaphoresis, fatigue and fever.  HENT: Negative.   Eyes: Negative.   Respiratory: Negative.   Cardiovascular: Negative.   Gastrointestinal: Negative.   Genitourinary: Positive for frequency and urgency. Negative for difficulty urinating, dysuria, flank pain, genital sores, hematuria, menstrual problem, pelvic pain, vaginal discharge and vaginal pain.  Musculoskeletal: Negative.   Skin: Negative.      Physical Exam Triage Vital Signs ED Triage Vitals  Enc Vitals Group     BP 02/07/20 1443 126/81     Pulse Rate 02/07/20 1443 (!) 49     Resp 02/07/20 1443 18     Temp 02/07/20 1443 (!) 97.3 F (36.3 C)     Temp Source 02/07/20 1443 Oral     SpO2 02/07/20 1443 99 %     Weight --      Height --      Head Circumference --      Peak Flow --      Pain Score 02/07/20 1446 0     Pain Loc --      Pain Edu? --      Excl. in GC? --    No data found.  Updated Vital Signs BP 126/81 (BP Location: Right  Arm)   Pulse (!) 49   Temp (!) 97.3 F (36.3 C) (Oral)   Resp 18   LMP 01/30/2020   SpO2 99%   Visual Acuity Right Eye Distance:   Left Eye Distance:   Bilateral Distance:    Right Eye Near:   Left Eye Near:    Bilateral Near:     Physical Exam Nursing notes and Vital Signs reviewed. Appearance:  Patient appears stated age, and in no acute distress.    Eyes:  Pupils are equal, round, and reactive to light and accomodation.  Extraocular movement is intact.  Conjunctivae are not inflamed   Pharynx:  Normal; moist mucous membranes  Neck:  Supple.  No adenopathy Lungs:  Clear to auscultation.  Breath sounds are equal.  Moving air well. Heart:  Regular rate and rhythm without murmurs, rubs, or gallops.  Abdomen:  Nontender without masses or hepatosplenomegaly.  Bowel sounds are present.  No CVA or flank tenderness.    Extremities:  No edema.  Skin:  No rash present.   Pelvic exam deferred; patient self-obtained vaginal specimen  UC Treatments / Results  Labs (all labs ordered are listed, but only abnormal results are displayed) Labs Reviewed  POCT URINALYSIS DIP (MANUAL ENTRY) - Abnormal; Notable for the following components:      Result Value   Clarity, UA cloudy (*)    Blood, UA trace-intact (*)    All other components within normal limits  URINE CULTURE  RPR  HIV ANTIBODY (ROUTINE TESTING W REFLEX)  HEMOGLOBIN A1C  CERVICOVAGINAL ANCILLARY ONLY    EKG   Radiology No results found.  Procedures Procedures (including critical care time)  Medications Ordered in UC Medications - No data to display  Initial Impression / Assessment and Plan / UC Course  I have reviewed the triage vital signs and the nursing notes.  Pertinent labs & imaging results that were available during my care of the patient were reviewed by me and considered in my medical decision making (see chart for details).  Clinical Course as of Feb 06 1700  Tue Feb 07, 2020  1649 POCT urinalysis dipstick (new) [SB]    Clinical Course User Index [SB] Lattie Haw, MD   Check Hgb A1C Await results of lab testing.   Final Clinical Impressions(s) / UC Diagnoses   Final diagnoses:  Vaginal itching  Screen for STD (sexually transmitted disease)  Hematuria, unspecified type  Urinary frequency     Discharge Instructions     Will wait until after lab results are available before starting any treatment.    ED Prescriptions    None        Lattie Haw, MD 02/10/20 1132

## 2020-02-07 NOTE — Telephone Encounter (Signed)
Call to pt regarding HgbA1c lab not being drawn. Pt is a difficult stick & RN drawing did not know a lavender top was needed. Apologized to [facility] for the confusion, she stated she would return after class tomorrow around 1330 for a redraw or go to [facility] for lab draw.

## 2020-02-07 NOTE — ED Triage Notes (Signed)
Pt c/o increased urinary urgency. No dysuria. But some burning and itching. Started 8 days ago. Would also like to be tested for yeast/STDs.

## 2020-02-07 NOTE — Discharge Instructions (Addendum)
Will wait until after lab results are available before starting any treatment.

## 2020-02-08 LAB — CERVICOVAGINAL ANCILLARY ONLY
Bacterial Vaginitis (gardnerella): NEGATIVE
Candida Glabrata: NEGATIVE
Candida Vaginitis: NEGATIVE
Chlamydia: NEGATIVE
Comment: NEGATIVE
Comment: NEGATIVE
Comment: NEGATIVE
Comment: NEGATIVE
Comment: NEGATIVE
Comment: NORMAL
Neisseria Gonorrhea: NEGATIVE
Trichomonas: NEGATIVE

## 2020-02-08 LAB — HIV ANTIBODY (ROUTINE TESTING W REFLEX): HIV 1&2 Ab, 4th Generation: NONREACTIVE

## 2020-02-08 LAB — RPR: RPR Ser Ql: NONREACTIVE

## 2020-02-09 LAB — URINE CULTURE
MICRO NUMBER:: 10980023
SPECIMEN QUALITY:: ADEQUATE

## 2020-02-09 LAB — HEMOGLOBIN A1C
Hgb A1c MFr Bld: 5.8 % of total Hgb — ABNORMAL HIGH (ref <5.7)
Mean Plasma Glucose: 120 (calc)
eAG (mmol/L): 6.6 (calc)

## 2020-09-21 DIAGNOSIS — E669 Obesity, unspecified: Secondary | ICD-10-CM | POA: Insufficient documentation

## 2020-09-24 DIAGNOSIS — E559 Vitamin D deficiency, unspecified: Secondary | ICD-10-CM | POA: Insufficient documentation

## 2020-09-24 DIAGNOSIS — R7303 Prediabetes: Secondary | ICD-10-CM | POA: Insufficient documentation

## 2020-10-02 ENCOUNTER — Encounter: Payer: Self-pay | Admitting: Physician Assistant

## 2020-10-02 ENCOUNTER — Telehealth: Payer: Medicaid Other | Admitting: Physician Assistant

## 2020-10-02 ENCOUNTER — Telehealth: Payer: 59 | Admitting: Physician Assistant

## 2020-10-02 DIAGNOSIS — R110 Nausea: Secondary | ICD-10-CM

## 2020-10-02 MED ORDER — ONDANSETRON 4 MG PO TBDP: 4.0000 mg | ORAL_TABLET | Freq: Three times a day (TID) | ORAL | 0 refills | Status: DC | PRN

## 2020-10-02 MED ORDER — OMEPRAZOLE MAGNESIUM 20 MG PO TBEC: 20.0000 mg | DELAYED_RELEASE_TABLET | Freq: Every day | ORAL | 0 refills | Status: DC

## 2020-10-02 NOTE — Progress Notes (Signed)
Ms. Bonnie Arnold, Bonnie Arnold are scheduled for a virtual visit with your provider today.    Just as we do with appointments in the office, we must obtain your consent to participate.  Your consent will be active for this visit and any virtual visit you may have with one of our providers in the next 365 days.    If you have a MyChart account, I can also send a copy of this consent to you electronically.  All virtual visits are billed to your insurance company just like a traditional visit in the office.  As this is a virtual visit, video technology does not allow for your provider to perform a traditional examination.  This may limit your provider's ability to fully assess your condition.  If your provider identifies any concerns that need to be evaluated in person or the need to arrange testing such as labs, EKG, etc, we will make arrangements to do so.    Although advances in technology are sophisticated, we cannot ensure that it will always work on either your end or our end.  If the connection with a video visit is poor, we may have to switch to a telephone visit.  With either a video or telephone visit, we are not always able to ensure that we have a secure connection.   I need to obtain your verbal consent now.   Are you willing to proceed with your visit today?   Bonnie Arnold has provided verbal consent on 10/02/2020 for a virtual visit (video or telephone).  Piedad Climes, PA-C 10/02/2020  10:29 AM  Virtual Visit via Video   I connected with patient on 10/02/20 at 10:45 AM EDT by a video enabled telemedicine application and verified that I am speaking with the correct person using two identifiers.  Location patient: Home Location provider: [facility] Persons participating in the virtual visit: Patient, Provider  I discussed the limitations of evaluation and management by telemedicine and the availability of in person appointments. The patient expressed understanding and  agreed to proceed.  Subjective:   HPI:   Patient presents via [facility] today c/o intermittent nausea over the past couple of weeks. Notes it comes and goes, worse after eating. Notes her symptoms started after taking Ibuprofen for several days in a row after having her tooth pull. Has not noted regular heartburn with this but notes some occasional LUQ discomfort. Denies melena, hematochezia or tenesmus. Denies fever, chills. Rare use of marijuana. No tobacco use. LMP ended on 09/07/2020 and notes was a little longer than usual. Has history of IDA 2/2 menstrual periods. Notes recent check up with PCP with lab work. Was told things looked good overall but had significantly low Vitamin D. Was started on once weekly Ergocalciferol, having just taken her first dose int he past 48 hours. Notes this medication seems to make her nausea worse.   ROS:   See pertinent positives and negatives per HPI.  Patient Active Problem List   Diagnosis Date Noted  . 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 Carpal tunnel syndrome on right 12/28/2017  . Irregular menses 10/14/2017  . Convulsions (HCC) 10/23/2012  . Disorder of menstrual bleeding 10/23/2012  . Perennial allergic rhinitis with seasonal variation 10/23/2012    Social History   Tobacco Use  . Smoking status: Former Smoker    Types: Cigars  . Smokeless tobacco: Never Used  Substance Use Topics  . Alcohol use: No    Current Outpatient Medications:  .  gabapentin (  NEURONTIN) 100 MG capsule, Take 1 capsule (100 mg total) by mouth 3 (three) times daily., Disp: 21 capsule, Rfl: 0  Allergies  Allergen Reactions  . Nickel     Objective:   There were no vitals taken for this visit.  Patient is well-developed, well-nourished in no acute distress.  Resting comfortably at home.  Head is normocephalic, atraumatic.  No labored breathing.  Speech is clear and coherent with logical content.  Patient is alert and oriented at baseline.   Assessment and  Plan:   1. Nausea LMP within past few weeks. Nausea starting after repetitive NSAID use and associated with LUQ pain and decreased appetite. No alarm signs or symptoms present. Feel likely mild GERD/Gastritis 2/2 NSAID use. She is to refrain from using this. Increase fluids. Follow GERD diet. Start Prilosec daily for the next 7-14 days. Zofran to use as directed if needed for nausea.  If not improving significantly in the next 3-4 days or not resolved in a week, want her to have in person evaluation with PCP. Discussed UC precautions with patient as well. Patient voiced understanding and agreement with the plan.  - omeprazole (PRILOSEC OTC) 20 MG tablet; Take 1 tablet (20 mg total) by mouth daily.  Dispense: 30 tablet; Refill: 0 - ondansetron (ZOFRAN ODT) 4 MG disintegrating tablet; Take 1 tablet (4 mg total) by mouth every 8 (eight) hours as needed for nausea or vomiting.  Dispense: 20 tablet; Refill: 0 .   Piedad Climes, PA-C 10/02/2020

## 2020-10-02 NOTE — Patient Instructions (Signed)
Instructions have been sent to patient's Mychart.

## 2020-10-18 DIAGNOSIS — Z8742 Personal history of other diseases of the female genital tract: Secondary | ICD-10-CM | POA: Insufficient documentation

## 2020-10-18 DIAGNOSIS — R1031 Right lower quadrant pain: Secondary | ICD-10-CM | POA: Insufficient documentation

## 2020-10-18 DIAGNOSIS — R87610 Atypical squamous cells of undetermined significance on cytologic smear of cervix (ASC-US): Secondary | ICD-10-CM | POA: Insufficient documentation

## 2020-11-07 DIAGNOSIS — N949 Unspecified condition associated with female genital organs and menstrual cycle: Secondary | ICD-10-CM | POA: Insufficient documentation

## 2020-11-07 DIAGNOSIS — R350 Frequency of micturition: Secondary | ICD-10-CM | POA: Insufficient documentation

## 2020-11-10 IMAGING — US US PELVIS COMPLETE TRANSABD/TRANSVAG W DUPLEX
1 series · 13 of 25 positions shown · non-contrast
Comparison: CT abdomen and pelvis 02/21/2017.

CLINICAL DATA: 29-year-old presenting with acute onset of pelvic
pain that began 4 days ago. LMP 08/18/2019. G0P0.

EXAM:
TRANSABDOMINAL AND TRANSVAGINAL ULTRASOUND OF PELVIS
DOPPLER ULTRASOUND OF OVARIES
TECHNIQUE: Both transabdominal and transvaginal ultrasound examinations of the
pelvis were performed. Transabdominal technique was performed for
global imaging of the pelvis including uterus, ovaries, adnexal
regions, and pelvic cul-de-sac.
It was necessary to proceed with endovaginal exam following the
transabdominal exam to visualize the endometrium and ovaries as the
bladder was incompletely distended. Color and duplex Doppler
ultrasound was utilized to evaluate blood flow to the ovaries.

[Series 1: us pelvis complete transabd/transvag w duplex · 0.24mm/px · 137 acquisitions, 13 frames shown]
[im 1/137]
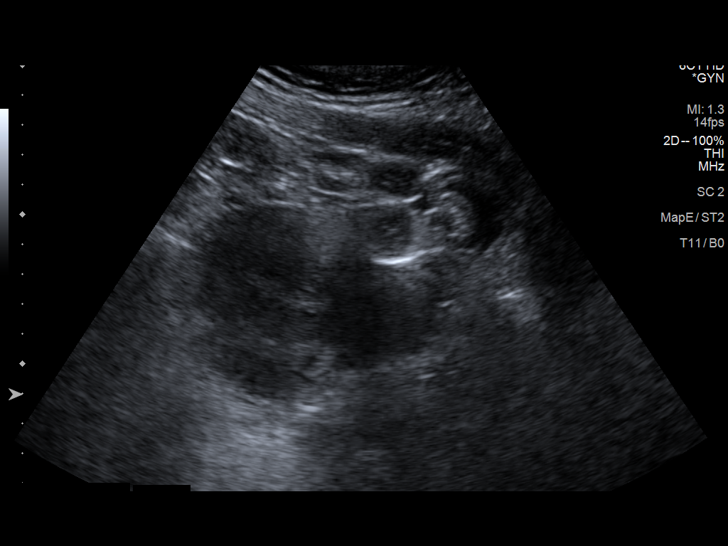
[im 12/137]
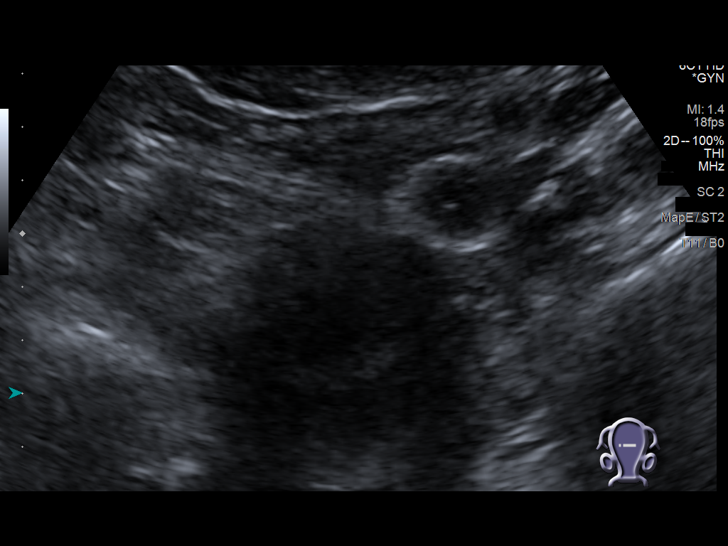
[im 23/137]
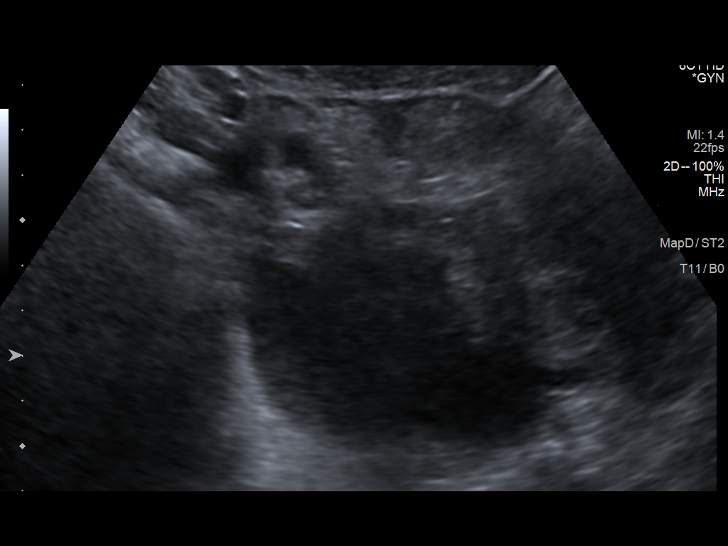
[im 35/137]
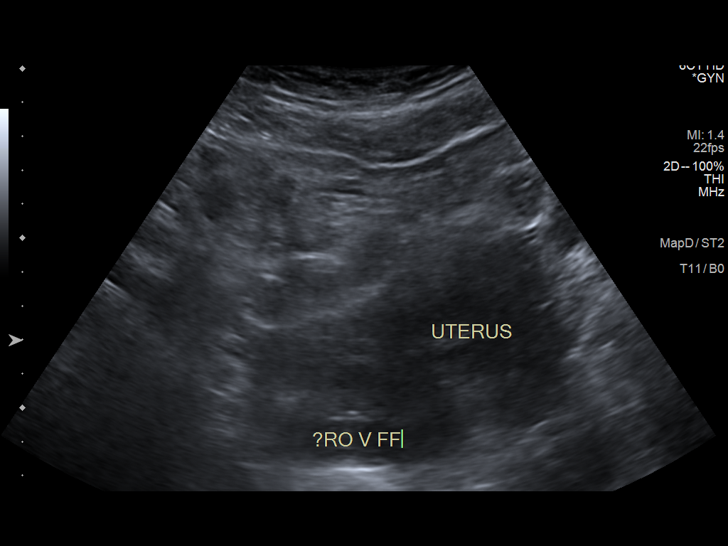
[im 46/137]
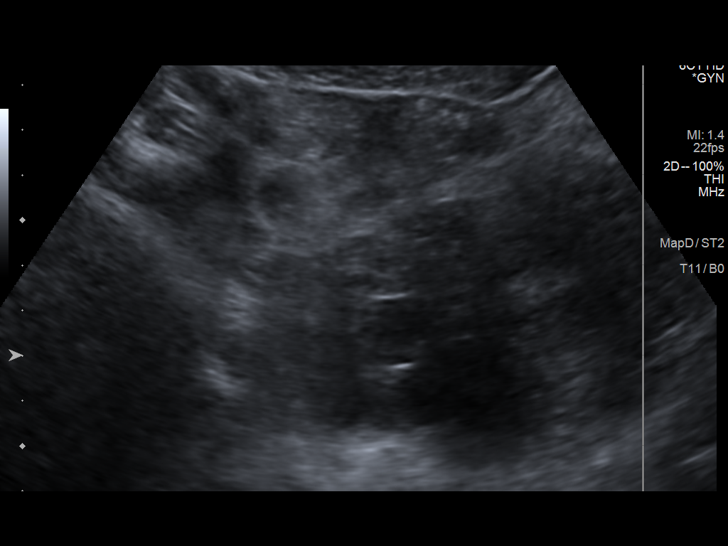
[im 57/137]
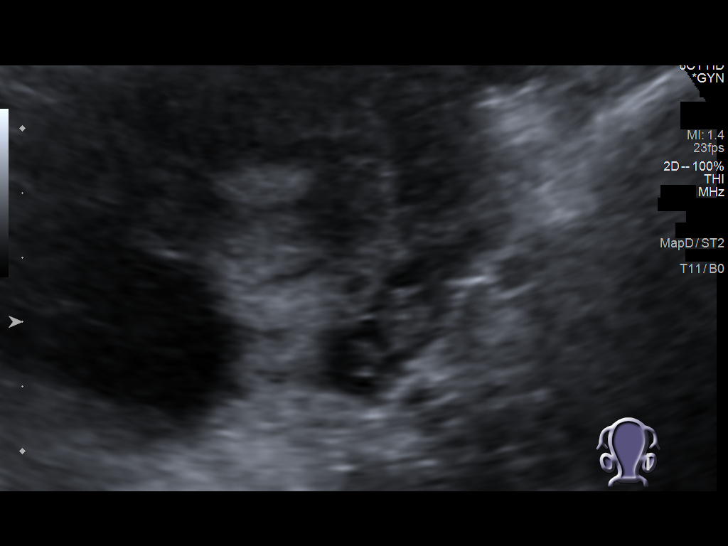
[im 69/137]
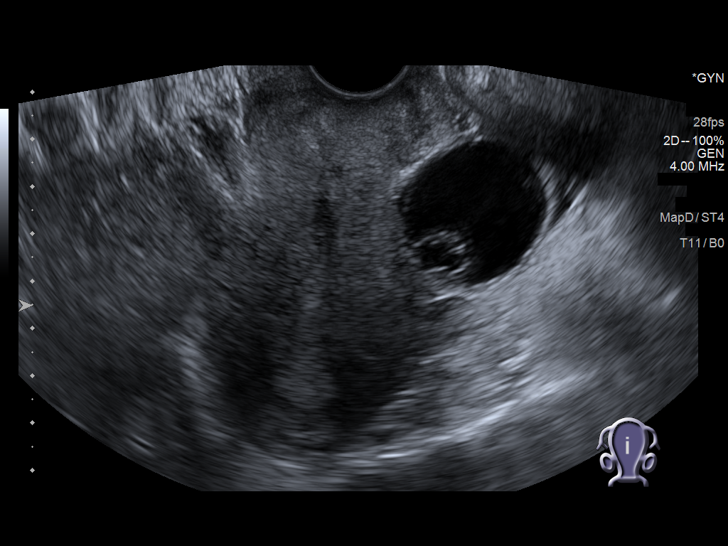
[im 80/137]
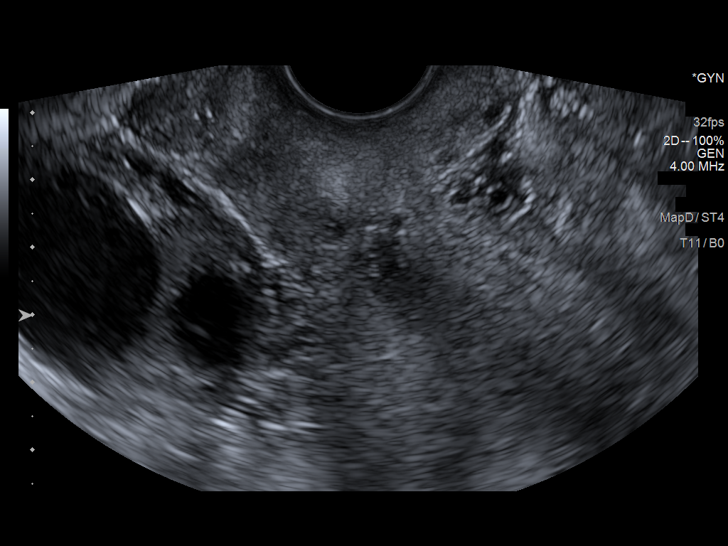
[im 91/137]
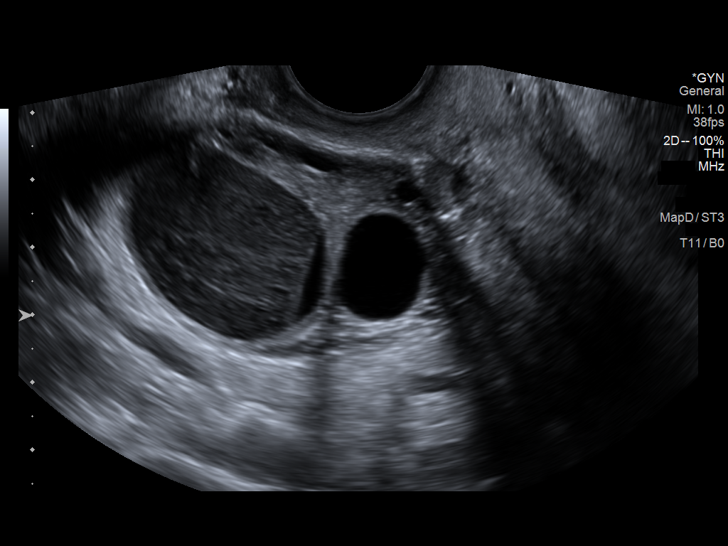
[im 103/137]
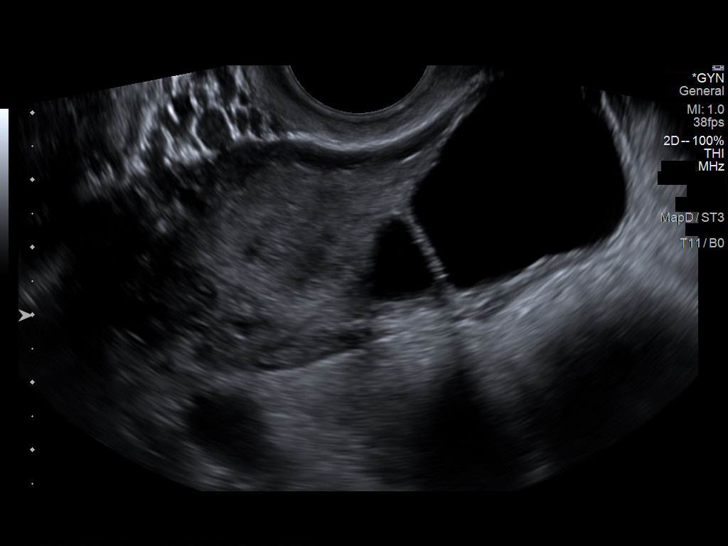
[im 114/137]
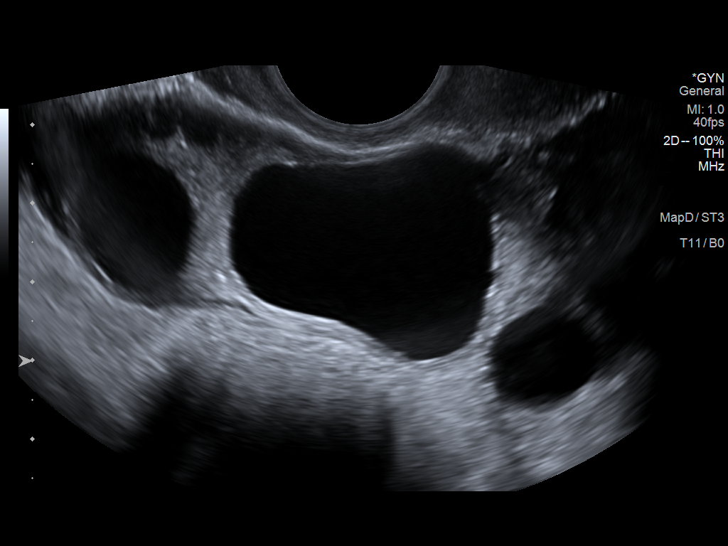
[im 125/137]
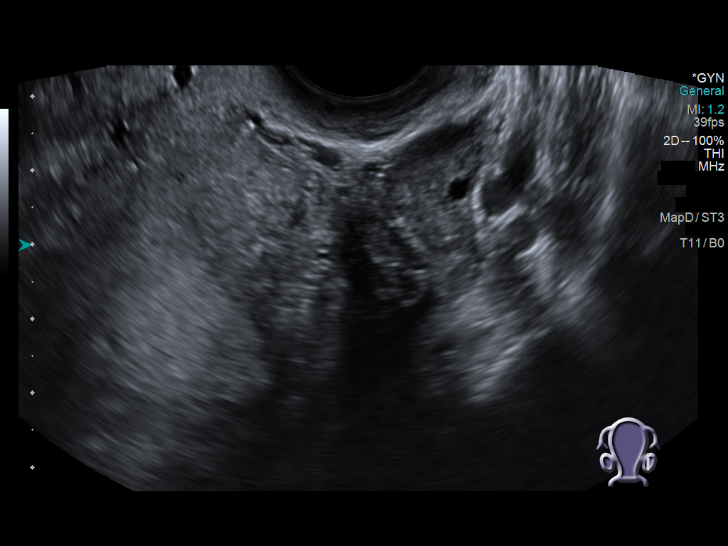
[im 137/137]
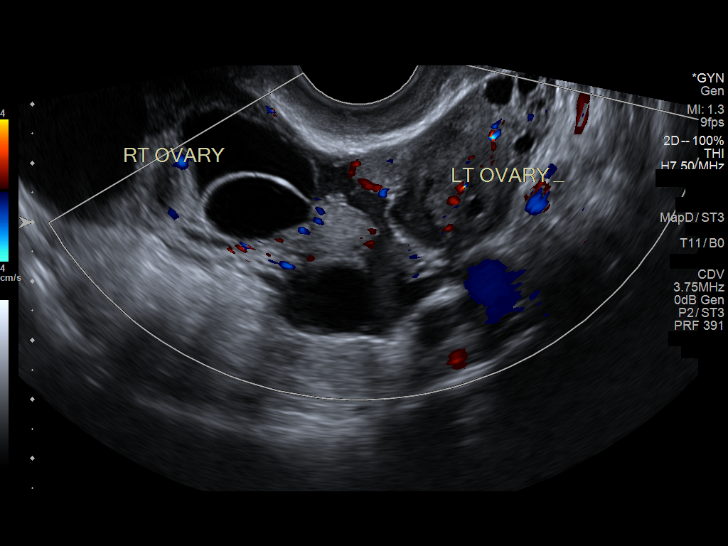

[13 of 25 positions shown; findings below may reference images not displayed]

FINDINGS: Uterus

Measurements: Approximately 7.3 x 4.8 x 6.1 cm = volume: 112 mL.
Retroverted. Homogeneous echotexture without focal fibroid or other
myometrial abnormality. Normal-appearing uterine cervix.

Endometrium

Thickness: Approximately 13 mm. Normal appearance without evidence
of endometrial fluid or mass.

Right ovary

Measurements: Approximately 7.2 x 3.5 x 4.9 cm = volume: 65 mL.
Numerous cysts involving the RIGHT ovary, including a complicated
cyst (favored over a solid mass as there is no internal color
doppler flow). The complicated cyst measures approximately 3.4 x
x 3.3 cm. The largest cyst measures approximately 4.6 x 2.4 x
cm, containing internal septations and/or daughter cysts. Normal
color doppler flow is present within the ovary.

Left ovary

Measurements: Approximately 3.8 x 1.7 x 2.0 cm = volume: 7 mL. Small
follicular cysts. No dominant cyst or solid mass. Normal color
Doppler flow within the ovary.

Pulsed Doppler evaluation of both ovaries demonstrates normal
low-resistance arterial and venous waveforms.

Other findings

Trace free pelvic fluid.
IMPRESSION: 1. Numerous RIGHT ovarian cysts including a likely hemorrhagic cyst
(favored over a solid mass as there is no internal blood flow), the
largest measured above. I believe these are more likely to represent
separate cysts rather than one large multicystic mass. Further
imaging with non-emergent MRI of the pelvis may be confirmatory.
2. Normal appearing uterus and LEFT ovary.
3. Trace, likely physiologic free pelvic fluid.

## 2020-11-22 ENCOUNTER — Telehealth: Payer: 59 | Admitting: Physician Assistant

## 2020-11-22 DIAGNOSIS — N760 Acute vaginitis: Secondary | ICD-10-CM

## 2020-11-22 MED ORDER — FLUCONAZOLE 150 MG PO TABS: 150.0000 mg | ORAL_TABLET | Freq: Once | ORAL | 0 refills | Status: DC

## 2020-11-22 MED ORDER — METRONIDAZOLE 500 MG PO TABS: 500.0000 mg | ORAL_TABLET | Freq: Two times a day (BID) | ORAL | 0 refills | Status: DC

## 2020-11-22 NOTE — Progress Notes (Signed)

## 2020-11-22 NOTE — Addendum Note (Signed)
Addended by: Waldon Merl on: 11/22/2020 03:21 PM   Modules accepted: Orders

## 2020-11-22 NOTE — Progress Notes (Signed)
I have spent 5 minutes in review of e-visit questionnaire, review and updating patient chart, medical decision making and response to patient.   Bashar Milam Cody Justene Jensen, PA-C    

## 2021-01-27 ENCOUNTER — Telehealth: Payer: 59 | Admitting: Family

## 2021-01-27 DIAGNOSIS — M545 Low back pain, unspecified: Secondary | ICD-10-CM

## 2021-01-27 MED ORDER — BACLOFEN 10 MG PO TABS: 10.0000 mg | ORAL_TABLET | Freq: Three times a day (TID) | ORAL | 0 refills | Status: DC

## 2021-01-27 MED ORDER — DICLOFENAC SODIUM 75 MG PO TBEC: 75.0000 mg | DELAYED_RELEASE_TABLET | Freq: Two times a day (BID) | ORAL | 0 refills | Status: DC

## 2021-01-27 NOTE — Progress Notes (Signed)
Virtual Visit Consent   Bonnie Arnold, you are scheduled for a virtual visit with a  provider today.     Just as with appointments in the office, your consent must be obtained to participate.  Your consent will be active for this visit and any virtual visit you may have with one of our providers in the next 365 days.     If you have a MyChart account, a copy of this consent can be sent to you electronically.  All virtual visits are billed to your insurance company just like a traditional visit in the office.    As this is a virtual visit, video technology does not allow for your provider to perform a traditional examination.  This may limit your provider's ability to fully assess your condition.  If your provider identifies any concerns that need to be evaluated in person or the need to arrange testing (such as labs, EKG, etc.), we will make arrangements to do so.     Although advances in technology are sophisticated, we cannot ensure that it will always work on either your end or our end.  If the connection with a video visit is poor, the visit may have to be switched to a telephone visit.  With either a video or telephone visit, we are not always able to ensure that we have a secure connection.     I need to obtain your verbal consent now.   Are you willing to proceed with your visit today?    [name] has provided verbal consent on 01/27/2021 for a virtual visit (video or telephone).   Jannifer Rodney, FNP   Date: 01/27/2021 5:36 PM   Virtual Visit via Video Note   I, Jannifer Rodney, connected with  Bonnie Arnold  ([id number], [birth year]) on 01/27/21 at  5:15 PM EDT by a video-enabled telemedicine application and verified that I am speaking with the correct person using two identifiers.  Location: Patient: Virtual Visit Location Patient: Home Provider: Virtual Visit Location Provider: Home   I discussed the limitations of evaluation and management by telemedicine  and the availability of in person appointments. The patient expressed understanding and agreed to proceed.    History of Present Illness: Bonnie Arnold is a 30 y.o. who identifies as a female who was assigned female at birth, and is being seen today for back pain.  HPI: Back Pain This is a new problem. The current episode started in the past 7 days. The problem occurs constantly. The problem has been gradually worsening since onset. The pain is present in the lumbar spine. The quality of the pain is described as aching. The pain is at a severity of 8/10. The pain is mild. Pertinent negatives include no chest pain, dysuria, fever, headaches, pelvic pain, perianal numbness or tingling. She has tried NSAIDs for the symptoms. The treatment provided mild relief.   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   Carpal tunnel syndrome on right 12/28/2017   Irregular menses 10/14/2017   Convulsions (HCC) 10/23/2012   Disorder of menstrual bleeding 10/23/2012   Perennial allergic rhinitis with seasonal variation 10/23/2012    Allergies:  Allergies  Allergen Reactions   Nickel    Medications:  Current Outpatient Medications:    baclofen (LIORESAL) 10 MG tablet, Take 1 tablet (10 mg total) by mouth 3 (three) times daily., Disp: 30 each, Rfl: 0   diclofenac (VOLTAREN) 75 MG EC tablet, Take 1  tablet (75 mg total) by mouth 2 (two) times daily., Disp: 30 tablet, Rfl: 0  Observations/Objective: Patient is well-developed, well-nourished in no acute distress.  Resting comfortably  at home.  Head is normocephalic, atraumatic.  No labored breathing.  Speech is clear and coherent with logical content.  Patient is alert and oriented at baseline.    Assessment and Plan: 1. Acute bilateral low back pain, unspecified whether sciatica present - baclofen (LIORESAL) 10 MG tablet; Take 1 tablet (10 mg total) by mouth 3 (three) times daily.  Dispense: 30  each; Refill: 0 - diclofenac (VOLTAREN) 75 MG EC tablet; Take 1 tablet (75 mg total) by mouth 2 (two) times daily.  Dispense: 30 tablet; Refill: 0 Rest ROM exercises  No other NSAID's while taking diclofenac Sedation precautions discussed  Work noted given   Follow Up Instructions: I discussed the assessment and treatment plan with the patient. The patient was provided an opportunity to ask questions and all were answered. The patient agreed with the plan and demonstrated an understanding of the instructions.  A copy of instructions were sent to the patient via MyChart.  The patient was advised to call back or seek an in-person evaluation if the symptoms worsen or if the condition fails to improve as anticipated.  Time:  I spent 7 minutes with the patient via telehealth technology discussing the above problems/concerns.    Jannifer Rodney, FNP

## 2021-01-27 NOTE — Progress Notes (Signed)
Attempted to connect with patient multiple times. Will close chart.   Jannifer Rodney, FNP

## 2021-03-04 ENCOUNTER — Telehealth: Payer: 59

## 2021-03-07 ENCOUNTER — Telehealth: Payer: 59 | Admitting: Physician Assistant

## 2021-03-07 DIAGNOSIS — U071 COVID-19: Secondary | ICD-10-CM | POA: Diagnosis not present

## 2021-03-07 MED ORDER — FLUTICASONE PROPIONATE 50 MCG/ACT NA SUSP: 2.0000 | Freq: Every day | NASAL | 0 refills | Status: DC

## 2021-03-07 MED ORDER — BENZONATATE 100 MG PO CAPS: 100.0000 mg | ORAL_CAPSULE | Freq: Three times a day (TID) | ORAL | 0 refills | Status: DC | PRN

## 2021-03-07 NOTE — Progress Notes (Signed)
Virtual Visit Consent   Marixa Mellott, you are scheduled for a virtual visit with a [place] provider today.     Just as with appointments in the office, your consent must be obtained to participate.  Your consent will be active for this visit and any virtual visit you may have with one of our providers in the next 365 days.     If you have a MyChart account, a copy of this consent can be sent to you electronically.  All virtual visits are billed to your insurance company just like a traditional visit in the office.    As this is a virtual visit, video technology does not allow for your provider to perform a traditional examination.  This may limit your provider's ability to fully assess your condition.  If your provider identifies any concerns that need to be evaluated in person or the need to arrange testing (such as labs, EKG, etc.), we will make arrangements to do so.     Although advances in technology are sophisticated, we cannot ensure that it will always work on either your end or our end.  If the connection with a video visit is poor, the visit may have to be switched to a telephone visit.  With either a video or telephone visit, we are not always able to ensure that we have a secure connection.     I need to obtain your verbal consent now.   Are you willing to proceed with your visit today?    Cindia Zahler has provided verbal consent on 03/07/2021 for a virtual visit (video or telephone).   Margaretann Loveless, PA-C   Date: 03/07/2021 10:46 AM   Virtual Visit via Video Note   I, Margaretann Loveless, connected with  Bonnie Arnold  ([id number], 1990-08-25) on 03/07/21 at 10:30 AM EDT by a video-enabled telemedicine application and verified that I am speaking with the correct person using two identifiers.  Location: Patient: Virtual Visit Location Patient: Home Provider: Virtual Visit Location Provider: Home Office   I discussed the limitations of evaluation and  management by telemedicine and the availability of in person appointments. The patient expressed understanding and agreed to proceed.    History of Present Illness: Bonnie Arnold is a 30 y.o. who identifies as a female who was assigned female at birth, and is being seen today for Covid 63.  HPI: URI  This is a new problem. Episode onset: tested positive for Covid 19 on Saturday; symptoms started last Thursday 02/28/21. The problem has been gradually worsening. The maximum temperature recorded prior to her arrival was 101 - 101.9 F. Fever duration: no fever since Sunday. Associated symptoms include congestion, headaches, rhinorrhea, sinus pain and a sore throat. Associated symptoms comments: Sweats (last two nights), myalgias. Treatments tried: alka sletzer cold and flu. The treatment provided mild relief.     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   Carpal tunnel syndrome on right 12/28/2017   Irregular menses 10/14/2017   Convulsions (HCC) 10/23/2012   Disorder of menstrual bleeding 10/23/2012   Perennial allergic rhinitis with seasonal variation 10/23/2012    Allergies:  Allergies  Allergen Reactions   Nickel    Medications:  Current Outpatient Medications:    benzonatate (TESSALON) 100 MG capsule, Take 1 capsule (100 mg total) by mouth 3 (three) times daily as needed., Disp: 30 capsule, Rfl: 0   fluticasone (FLONASE) 50 MCG/ACT nasal spray, Place 2  sprays into both nostrils daily., Disp: 16 g, Rfl: 0   baclofen (LIORESAL) 10 MG tablet, Take 1 tablet (10 mg total) by mouth 3 (three) times daily., Disp: 30 each, Rfl: 0   diclofenac (VOLTAREN) 75 MG EC tablet, Take 1 tablet (75 mg total) by mouth 2 (two) times daily., Disp: 30 tablet, Rfl: 0  Observations/Objective: Patient is well-developed, well-nourished in no acute distress.  Resting comfortably at home.  Head is normocephalic, atraumatic.  No labored breathing.  Speech  is clear and coherent with logical content.  Patient is alert and oriented at baseline.    Assessment and Plan: 1. COVID-19 - MyChart COVID-19 home monitoring program; Future - fluticasone (FLONASE) 50 MCG/ACT nasal spray; Place 2 sprays into both nostrils daily.  Dispense: 16 g; Refill: 0 - benzonatate (TESSALON) 100 MG capsule; Take 1 capsule (100 mg total) by mouth 3 (three) times daily as needed.  Dispense: 30 capsule; Refill: 0 - Continue OTC symptomatic management of choice - Will send OTC vitamins and supplement information through [facility] - Tessalon and flonase prescribed - Patient enrolled in MyChart symptom monitoring - Push fluids - Rest as needed - Discussed return precautions and when to seek in-person evaluation, sent via [facility] as well  Follow Up Instructions: I discussed the assessment and treatment plan with the patient. The patient was provided an opportunity to ask questions and all were answered. The patient agreed with the plan and demonstrated an understanding of the instructions.  A copy of instructions were sent to the patient via MyChart unless otherwise noted below.    The patient was advised to call back or seek an in-person evaluation if the symptoms worsen or if the condition fails to improve as anticipated.  Time:  I spent 15 minutes with the patient via telehealth technology discussing the above problems/concerns.    Margaretann Loveless, PA-C

## 2021-03-07 NOTE — Patient Instructions (Signed)
Hello Bonnie "Bonnie Arnold",  You are being placed in the home monitoring program for COVID-19 (commonly known as Coronavirus).  This is because you are suspected to have the virus or are known to have the virus.  If you are unsure which group you fall into call your clinic.    As part of this program, you'll answer a daily questionnaire in the MyChart mobile app. You'll receive a notification through the MyChart app when the questionnaire is available. When you log in to MyChart, you'll see the tasks in your To Do activity.       Clinicians will see any answers that are concerning and take appropriate steps.  If at any point you are having a medical emergency, call 911.  If otherwise concerned call your clinic instead of coming into the clinic or hospital.  To keep from spreading the disease you should: Stay home and limit contact with other people as much as possible.  Wash your hands frequently. Cover your coughs and sneezes with a tissue, and throw used tissues in the trash.   Clean and disinfect frequently touched surfaces and objects.    Take care of yourself by: Staying home Resting Drinking fluids Take fever-reducing medications (Tylenol/Acetaminophen and Ibuprofen)  For more information on the disease go to the Centers for Disease Control and Prevention website     Can take to lessen severity: Vit C 500mg  twice daily Quercertin 250-500mg  twice daily Zinc 75-100mg  daily Melatonin 3-6 mg at bedtime Vit D3 1000-2000 IU daily Aspirin 81 mg daily with food Optional: Famotidine 20mg  daily Also can add tylenol/ibuprofen as needed for fevers and body aches May add Mucinex or Mucinex DM as needed for cough/congestion  10 Things You Can Do to Manage Your COVID-19 Symptoms at Home If you have possible or confirmed COVID-19 Stay home except to get medical care. Monitor your symptoms carefully. If your symptoms get worse, call your healthcare provider immediately. Get rest and stay  hydrated. If you have a medical appointment, call the healthcare provider ahead of time and tell them that you have or may have COVID-19. For medical emergencies, call 911 and notify the dispatch personnel that you have or may have COVID-19. Cover your cough and sneezes with a tissue or use the inside of your elbow. Wash your hands often with soap and water for at least 20 seconds or clean your hands with an alcohol-based hand sanitizer that contains at least 60% alcohol. As much as possible, stay in a specific room and away from other people in your home. Also, you should use a separate bathroom, if available. If you need to be around other people in or outside of the home, wear a mask. Avoid sharing personal items with other people in your household, like dishes, towels, and bedding. Clean all surfaces that are touched often, like counters, tabletops, and doorknobs. Use household cleaning sprays or wipes according to the label instructions. 12/02/2019 This information is not intended to replace advice given to you by your health care provider. Make sure you discuss any questions you have with your health care provider. Document Revised: 09/20/2020 Document Reviewed: 09/20/2020 Elsevier Patient Education  2022 11/20/2020.

## 2021-05-06 ENCOUNTER — Telehealth: Payer: 59 | Admitting: Physician Assistant

## 2021-05-06 DIAGNOSIS — R6889 Other general symptoms and signs: Secondary | ICD-10-CM

## 2021-05-06 MED ORDER — BENZONATATE 100 MG PO CAPS: 100.0000 mg | ORAL_CAPSULE | Freq: Three times a day (TID) | ORAL | 0 refills | Status: DC | PRN

## 2021-05-06 NOTE — Progress Notes (Signed)
E visit for Flu like symptoms   We are sorry that you are not feeling well.  Here is how we plan to help! Based on what you have shared with me it looks like you may have a respiratory virus that may be influenza.  Influenza or the flu is   an infection caused by a respiratory virus. The flu virus is highly contagious and persons who did not receive their yearly flu vaccination may catch the flu from close contact.  We have anti-viral medications to treat the viruses that cause this infection. They are not a cure and only shorten the course of the infection. These prescriptions are most effective when they are given within the first 2 days of flu symptoms. Antiviral medication are indicated if you have a high risk of complications from the flu. You should  also consider an antiviral medication if you are in close contact with someone who is at risk. These medications can help patients avoid complications from the flu  but have side effects that you should know. Possible side effects from Tamiflu or oseltamivir include nausea, vomiting, diarrhea, dizziness, headaches, eye redness, sleep problems or other respiratory symptoms. You should not take Tamiflu if you have an allergy to oseltamivir or any to the ingredients in Tamiflu.  Based upon your symptoms and potential risk factors I recommend that you follow the flu symptoms recommendation that I have listed below.  I have sent in Tessalon perles for cough.  ANYONE WHO HAS FLU SYMPTOMS SHOULD: Stay home. The flu is highly contagious and going out or to work exposes others! Be sure to drink plenty of fluids. Water is fine as well as fruit juices, sodas and electrolyte beverages. You may want to stay away from caffeine or alcohol. If you are nauseated, try taking small sips of liquids. How do you know if you are getting enough fluid? Your urine should be a pale yellow or almost colorless. May use medications of choice over the counter for  symptomatic management. Get rest. Taking a steamy shower or using a humidifier may help nasal congestion and ease sore throat pain. Using a saline nasal spray works much the same way. Cough drops, hard candies and sore throat lozenges may ease your cough. Line up a caregiver. Have someone check on you regularly.   GET HELP RIGHT AWAY IF: You cannot keep down liquids or your medications. You become short of breath Your fell like you are going to pass out or loose consciousness. Your symptoms persist after you have completed your treatment plan MAKE SURE YOU  Understand these instructions. Will watch your condition. Will get help right away if you are not doing well or get worse.  Your e-visit answers were reviewed by a board certified advanced clinical practitioner to complete your personal care plan.  Depending on the condition, your plan could have included both over the counter or prescription medications.  If there is a problem please reply  once you have received a response from your provider.  Your safety is important to [place].  If you have drug allergies check your prescription carefully.    You can use MyChart to ask questions about todays visit, request a non-urgent call back, or ask for a work or school excuse for 24 hours related to this e-Visit. If it has been greater than 24 hours you will need to follow up with your provider, or enter a new e-Visit to address those concerns.  You will get an e-mail  in the next two days asking about your experience.  I hope that your e-visit has been valuable and will speed your recovery. Thank you for using e-visits.  I provided 5 minutes of non face-to-face time during this encounter for chart review and documentation.

## 2021-05-18 ENCOUNTER — Telehealth: Payer: 59 | Admitting: Physician Assistant

## 2021-05-18 DIAGNOSIS — N760 Acute vaginitis: Secondary | ICD-10-CM

## 2021-05-18 DIAGNOSIS — B9689 Other specified bacterial agents as the cause of diseases classified elsewhere: Secondary | ICD-10-CM

## 2021-05-18 MED ORDER — METRONIDAZOLE 500 MG PO TABS: 500.0000 mg | ORAL_TABLET | Freq: Two times a day (BID) | ORAL | 0 refills | Status: DC

## 2021-05-18 NOTE — Progress Notes (Signed)

## 2021-05-18 NOTE — Progress Notes (Signed)
I have spent 5 minutes in review of e-visit questionnaire, review and updating patient chart, medical decision making and response to patient.   Jerik Falletta Cody Jonatha Gagen, PA-C    

## 2021-07-24 ENCOUNTER — Telehealth: Payer: 59 | Admitting: Physician Assistant

## 2021-07-24 DIAGNOSIS — R34 Anuria and oliguria: Secondary | ICD-10-CM

## 2021-07-24 DIAGNOSIS — R42 Dizziness and giddiness: Secondary | ICD-10-CM

## 2021-07-24 DIAGNOSIS — R112 Nausea with vomiting, unspecified: Secondary | ICD-10-CM

## 2021-07-24 NOTE — Progress Notes (Signed)
Based on what you shared with me, I feel your condition warrants further evaluation as soon as possible at an Emergency department.  ? ?Giving little urine output, weakness and dizziness after vomiting and significant diarrhea, you are dehydrated and likely need IV fluids. Also giving pus in the stool we are concerned about a bacterial infection like colitis or an inflammatory cause of diarrhea which needs further evaluation. An Urgent Care will not be able to provide fluids via IV so I am recommending an ER evaluation.  ?  ?NOTE: There will be NO CHARGE for this eVisit ?  ?If you are having a true medical emergency please call 911.   ?  ? ?Emergency Department-[facility]  ?Get Driving Directions  ?[phone]  ?[address]  ?[place], [place] [address]  ?Open 24/7/365  ?  ?  ?[facility] Emergency Department at [facility]  ?Get Driving Directions  ?[address]  ?[place], [place] [address]  ?Open 24/7/365  ?  ?Emergency Department- [facility]  ?Get Driving Directions  ?[phone]  ?[address]  ?[place], [place] [address]  ?Open 24/7/365  ?  ?  ?[facility] Emergency Department at [facility]  ?Get Driving Directions  ?[phone]  ?[address]  ?[place], [place] [address]  ?Open 24/7/365  ?  ?[place]  ?Emergency Department- [facility]  ?Get Driving Directions  ?[phone]  ?[address]  ?[place], [place] [address]  ?Open 24/7/365  ?  ?[place]  ?Emergency Department- [facility] [place]  ?Get Driving Directions  ?[address]  ?[place], [place] [address]  ?Open 24/7/365  ?  ?[place]  ?Emergency Department- [facility]  ?Get Driving Directions  ?[phone]  ?[address]  ?[place], [place] [address]  ?Open 24/7/365  ? ? ?

## 2021-07-28 ENCOUNTER — Telehealth: Payer: 59 | Admitting: Physician Assistant

## 2021-07-28 DIAGNOSIS — M549 Dorsalgia, unspecified: Secondary | ICD-10-CM | POA: Diagnosis not present

## 2021-07-29 MED ORDER — NAPROXEN 500 MG PO TABS
500.0000 mg | ORAL_TABLET | Freq: Two times a day (BID) | ORAL | 0 refills | Status: DC
Start: 2021-07-29 — End: 2022-05-29

## 2021-07-29 MED ORDER — BACLOFEN 10 MG PO TABS: 10.0000 mg | ORAL_TABLET | Freq: Three times a day (TID) | ORAL | 0 refills | Status: DC

## 2021-07-29 NOTE — Progress Notes (Signed)
I have spent 5 minutes in review of e-visit questionnaire, review and updating patient chart, medical decision making and response to patient.   Loleta Frommelt Cody Arora Coakley, PA-C    

## 2021-07-29 NOTE — Progress Notes (Signed)

## 2021-08-11 ENCOUNTER — Telehealth: Payer: 59 | Admitting: Nurse Practitioner

## 2021-08-11 DIAGNOSIS — J069 Acute upper respiratory infection, unspecified: Secondary | ICD-10-CM

## 2021-08-11 MED ORDER — FLUTICASONE PROPIONATE 50 MCG/ACT NA SUSP: 2.0000 | Freq: Every day | NASAL | 0 refills | Status: DC

## 2021-08-11 MED ORDER — BENZONATATE 100 MG PO CAPS: 100.0000 mg | ORAL_CAPSULE | Freq: Three times a day (TID) | ORAL | 0 refills | Status: AC | PRN

## 2021-08-11 NOTE — Progress Notes (Signed)
E-Visit for Upper Respiratory Infection  ? ?We are sorry you are not feeling well.  Here is how we plan to help! ? ?Based on what you have shared with me, it looks like you may have a viral upper respiratory infection.  Upper respiratory infections are caused by a large number of viruses; however, rhinovirus is the most common cause.  ? ?Symptoms vary from person to person, with common symptoms including sore throat, cough, fatigue or lack of energy and feeling of general discomfort.  A low-grade fever of up to 100.4 may present, but is often uncommon.  Symptoms vary however, and are closely related to a person's age or underlying illnesses.  The most common symptoms associated with an upper respiratory infection are nasal discharge or congestion, cough, sneezing, headache and pressure in the ears and face.  These symptoms usually persist for about 3 to 10 days, but can last up to 2 weeks.  It is important to know that upper respiratory infections do not cause serious illness or complications in most cases.   ? ?Upper respiratory infections can be transmitted from person to person, with the most common method of transmission being a person's hands.  The virus is able to live on the skin and can infect other persons for up to 2 hours after direct contact.  Also, these can be transmitted when someone coughs or sneezes; thus, it is important to cover the mouth to reduce this risk.  To keep the spread of the illness at [place], good hand hygiene is very important. ? ?This is an infection that is most likely caused by a virus. There are no specific treatments other than to help you with the symptoms until the infection runs its course.  We are sorry you are not feeling well.  Here is how we plan to help! ? ? ?For nasal congestion, you may use an oral decongestants such as Mucinex D or if you have glaucoma or high blood pressure use plain Mucinex.  Saline nasal spray or nasal drops can help and can safely be used as often as  needed for congestion.  For your congestion, I have prescribed Fluticasone nasal spray one spray in each nostril twice a day ? ?If you do not have a history of heart disease, hypertension, diabetes or thyroid disease, prostate/bladder issues or glaucoma, you may also use Sudafed to treat nasal congestion.  It is highly recommended that you consult with a pharmacist or your primary care physician to ensure this medication is safe for you to take.    ? ?If you have a cough, you may use cough suppressants such as Delsym and Robitussin.  If you have glaucoma or high blood pressure, you can also use Coricidin HBP.   ?For cough I have prescribed for you A prescription cough medication called Tessalon Perles 100 mg. You may take 1-2 capsules every 8 hours as needed for cough ? ?If you have a sore or scratchy throat, use a saltwater gargle- ? to ? teaspoon of salt dissolved in a 4-ounce to 8-ounce glass of warm water.  Gargle the solution for approximately 15-30 seconds and then spit.  It is important not to swallow the solution.  You can also use throat lozenges/cough drops and Chloraseptic spray to help with throat pain or discomfort.  Warm or cold liquids can also be helpful in relieving throat pain. ? ?For headache, pain or general discomfort, you can use Ibuprofen or Tylenol as directed.   ?Some authorities believe  that zinc sprays or the use of Echinacea may shorten the course of your symptoms.   HOME CARE Only take medications as instructed by your medical team. Be sure to drink plenty of fluids. Water is fine as well as fruit juices, sodas and electrolyte beverages. You may want to stay away from caffeine or alcohol. If you are nauseated, try taking small sips of liquids. How do you know if you are getting enough fluid? Your urine should be a pale yellow or almost colorless. Get rest. Taking a steamy shower or using a humidifier may help nasal congestion and ease sore throat pain. You can place a towel over  your head and breathe in the steam from hot water coming from a faucet. Using a saline nasal spray works much the same way. Cough drops, hard candies and sore throat lozenges may ease your cough. Avoid close contacts especially the very young and the elderly Cover your mouth if you cough or sneeze Always remember to wash your hands.   GET HELP RIGHT AWAY IF: You develop worsening fever. If your symptoms do not improve within 10 days You develop yellow or green discharge from your nose over 3 days. You have coughing fits You develop a severe head ache or visual changes. You develop shortness of breath, difficulty breathing or start having chest pain Your symptoms persist after you have completed your treatment plan  MAKE SURE YOU  Understand these instructions. Will watch your condition. Will get help right away if you are not doing well or get worse.  Thank you for choosing an e-visit.  Your e-visit answers were reviewed by a board certified advanced clinical practitioner to complete your personal care plan. Depending upon the condition, your plan could have included both over the counter or prescription medications.  Please review your pharmacy choice. Make sure the pharmacy is open so you can pick up prescription now. If there is a problem, you may contact your provider through MyChart messaging and have the prescription routed to another pharmacy.  Your safety is important to us. If you have drug allergies check your prescription carefully.   For the next 24 hours you can use MyChart to ask questions about today's visit, request a non-urgent call back, or ask for a work or school excuse. You will get an email in the next two days asking about your experience. I hope that your e-visit has been valuable and will speed your recovery.    I have spent at least 5 minutes reviewing and documenting in the patient's chart.  

## 2021-09-03 ENCOUNTER — Telehealth: Payer: Medicaid Other | Admitting: Emergency Medicine

## 2021-09-03 DIAGNOSIS — N898 Other specified noninflammatory disorders of vagina: Secondary | ICD-10-CM

## 2021-09-03 NOTE — Progress Notes (Signed)
Ms. Tarkington did not respond to follow up questions to questionnaire before Virtual Urgent Care closed for the day. Visit cancelled.  ? ? ?

## 2021-09-20 ENCOUNTER — Telehealth: Payer: Medicaid Other | Admitting: Family

## 2021-09-20 DIAGNOSIS — B3731 Acute candidiasis of vulva and vagina: Secondary | ICD-10-CM

## 2021-09-20 MED ORDER — METRONIDAZOLE 500 MG PO TABS: 500.0000 mg | ORAL_TABLET | Freq: Two times a day (BID) | ORAL | 0 refills | Status: DC

## 2021-09-20 MED ORDER — FLUCONAZOLE 150 MG PO TABS: 150.0000 mg | ORAL_TABLET | ORAL | 0 refills | Status: DC | PRN

## 2021-09-20 NOTE — Progress Notes (Signed)

## 2021-09-20 NOTE — Addendum Note (Signed)
Addended by: Evelina Dun A on: 09/20/2021 10:32 AM ? ? Modules accepted: Orders ? ?

## 2021-11-01 ENCOUNTER — Telehealth: Payer: Medicaid Other | Admitting: Nurse Practitioner

## 2021-11-01 DIAGNOSIS — B9789 Other viral agents as the cause of diseases classified elsewhere: Secondary | ICD-10-CM

## 2021-11-01 DIAGNOSIS — J329 Chronic sinusitis, unspecified: Secondary | ICD-10-CM

## 2021-11-02 ENCOUNTER — Other Ambulatory Visit: Payer: Self-pay | Admitting: Nurse Practitioner

## 2021-11-02 MED ORDER — AZELASTINE HCL 0.1 % NA SOLN: 1.0000 | Freq: Two times a day (BID) | NASAL | 0 refills | Status: DC

## 2021-11-02 MED ORDER — IBUPROFEN 600 MG PO TABS: 600.0000 mg | ORAL_TABLET | Freq: Three times a day (TID) | ORAL | 0 refills | Status: DC | PRN

## 2021-11-02 MED ORDER — BENZONATATE 100 MG PO CAPS: 100.0000 mg | ORAL_CAPSULE | Freq: Three times a day (TID) | ORAL | 0 refills | Status: DC | PRN

## 2021-11-02 NOTE — Progress Notes (Signed)
I have spent 5 minutes in review of e-visit questionnaire, review and updating patient chart, medical decision making and response to patient.  ° °Bonnie Arnold W Bonnie Furness, NP ° °  °

## 2021-11-02 NOTE — Progress Notes (Signed)
E-Visit for Upper Respiratory Infection   We are sorry you are not feeling well.  Here is how we plan to help!  Providers prescribe antibiotics to treat infections caused by bacteria. Antibiotics are very powerful in treating bacterial infections when they are used properly. To maintain their effectiveness, they should be used only when necessary. Overuse of antibiotics has resulted in the development of superbugs that are resistant to treatment!    After careful review of your answers, I would not recommend an antibiotic for your condition.  Antibiotics are not effective against viruses and therefore should not be used to treat them. Common examples of infections caused by viruses include colds and flu   PLEASE FEEL FREE TO REACH OUT TO [place] IF YOU ARE NOT FEELING BETTER IN 4-5 DAYS  Based on what you have shared with me, it looks like you may have a viral upper respiratory infection.  Upper respiratory infections are caused by a large number of viruses; however, rhinovirus is the most common cause.   Symptoms vary from person to person, with common symptoms including sore throat, cough, fatigue or lack of energy and feeling of general discomfort.  A low-grade fever of up to 100.4 may present, but is often uncommon.  Symptoms vary however, and are closely related to a person's age or underlying illnesses.  The most common symptoms associated with an upper respiratory infection are nasal discharge or congestion, cough, sneezing, headache and pressure in the ears and face.  These symptoms usually persist for about 3 to 10 days, but can last up to 2 weeks.  It is important to know that upper respiratory infections do not cause serious illness or complications in most cases.    Upper respiratory infections can be transmitted from person to person, with the most common method of transmission being a person's hands.  The virus is able to live on the skin and can infect other persons for up to 2 hours after  direct contact.  Also, these can be transmitted when someone coughs or sneezes; thus, it is important to cover the mouth to reduce this risk.  To keep the spread of the illness at bay, good hand hygiene is very important.  This is an infection that is most likely caused by a virus. There are no specific treatments other than to help you with the symptoms until the infection runs its course.  We are sorry you are not feeling well.  Here is how we plan to help!   For nasal congestion, you may use an oral decongestants such as Mucinex D or if you have glaucoma or high blood pressure use plain Mucinex.  Saline nasal spray or nasal drops can help and can safely be used as often as needed for congestion.  For your congestion, I have prescribed Azelastine nasal spray two sprays in each nostril twice a day  If you do not have a history of heart disease, hypertension, diabetes or thyroid disease, prostate/bladder issues or glaucoma, you may also use Sudafed to treat nasal congestion.  It is highly recommended that you consult with a pharmacist or your primary care physician to ensure this medication is safe for you to take.     If you have a cough, you may use cough suppressants such as Delsym and Robitussin.  If you have glaucoma or high blood pressure, you can also use Coricidin HBP.   For cough I have prescribed for you A prescription cough medication called Tessalon Perles 100  mg. You may take 1-2 capsules every 8 hours as needed for cough  For your headache I have prescribed prescription strengths MOTRIN.   If you have a sore or scratchy throat, use a saltwater gargle-  to  teaspoon of salt dissolved in a 4-ounce to 8-ounce glass of warm water.  Gargle the solution for approximately 15-30 seconds and then spit.  It is important not to swallow the solution.  You can also use throat lozenges/cough drops and Chloraseptic spray to help with throat pain or discomfort.  Warm or cold liquids can also be helpful  in relieving throat pain.  For headache, pain or general discomfort, you can use Ibuprofen or Tylenol as directed.   Some authorities believe that zinc sprays or the use of Echinacea may shorten the course of your symptoms.   HOME CARE Only take medications as instructed by your medical team. Be sure to drink plenty of fluids. Water is fine as well as fruit juices, sodas and electrolyte beverages. You may want to stay away from caffeine or alcohol. If you are nauseated, try taking small sips of liquids. How do you know if you are getting enough fluid? Your urine should be a pale yellow or almost colorless. Get rest. Taking a steamy shower or using a humidifier may help nasal congestion and ease sore throat pain. You can place a towel over your head and breathe in the steam from hot water coming from a faucet. Using a saline nasal spray works much the same way. Cough drops, hard candies and sore throat lozenges may ease your cough. Avoid close contacts especially the very young and the elderly Cover your mouth if you cough or sneeze Always remember to wash your hands.   GET HELP RIGHT AWAY IF: You develop worsening fever. If your symptoms do not improve within 10 days You develop yellow or green discharge from your nose over 3 days. You have coughing fits You develop a severe head ache or visual changes. You develop shortness of breath, difficulty breathing or start having chest pain Your symptoms persist after you have completed your treatment plan  MAKE SURE YOU  Understand these instructions. Will watch your condition. Will get help right away if you are not doing well or get worse.  Thank you for choosing an e-visit.  Your e-visit answers were reviewed by a board certified advanced clinical practitioner to complete your personal care plan. Depending upon the condition, your plan could have included both over the counter or prescription medications.  Please review your pharmacy  choice. Make sure the pharmacy is open so you can pick up prescription now. If there is a problem, you may contact your provider through [employer] and have the prescription routed to another pharmacy.  Your safety is important to [place]. If you have drug allergies check your prescription carefully.   For the next 24 hours you can use MyChart to ask questions about today's visit, request a non-urgent call back, or ask for a work or school excuse. You will get an email in the next two days asking about your experience. I hope that your e-visit has been valuable and will speed your recovery.

## 2021-11-23 ENCOUNTER — Telehealth: Payer: Medicaid Other | Admitting: Physician Assistant

## 2021-11-23 DIAGNOSIS — N760 Acute vaginitis: Secondary | ICD-10-CM

## 2021-11-23 MED ORDER — METRONIDAZOLE 500 MG PO TABS
500.0000 mg | ORAL_TABLET | Freq: Two times a day (BID) | ORAL | 0 refills | Status: DC
Start: 2021-11-23 — End: 2021-12-29

## 2021-11-23 NOTE — Progress Notes (Signed)
E-Visit for Vaginal Symptoms  We are sorry that you are not feeling well. Here is how we plan to help! Based on what you shared with me it looks like you: May have a vaginosis due to bacteria  Vaginosis is an inflammation of the vagina that can result in discharge, itching and pain. The cause is usually a change in the normal balance of vaginal bacteria or an infection. Vaginosis can also result from reduced estrogen levels after menopause.  The most common causes of vaginosis are:   Bacterial vaginosis which results from an overgrowth of one on several organisms that are normally present in your vagina.   Yeast infections which are caused by a naturally occurring fungus called candida.   Vaginal atrophy (atrophic vaginosis) which results from the thinning of the vagina from reduced estrogen levels after menopause.   Trichomoniasis which is caused by a parasite and is commonly transmitted by sexual intercourse.  Factors that increase your risk of developing vaginosis include: Medications, such as antibiotics and steroids Uncontrolled diabetes Use of hygiene products such as bubble bath, vaginal spray or vaginal deodorant Douching Wearing damp or tight-fitting clothing Using an intrauterine device (IUD) for birth control Hormonal changes, such as those associated with pregnancy, birth control pills or menopause Sexual activity Having a sexually transmitted infection  Your treatment plan is Metronidazole or Flagyl 500mg twice a day for 7 days.  I have electronically sent this prescription into the pharmacy that you have chosen.  Be sure to take all of the medication as directed. Stop taking any medication if you develop a rash, tongue swelling or shortness of breath. Mothers who are breast feeding should consider pumping and discarding their breast milk while on these antibiotics. However, there is no consensus that infant exposure at these doses would be harmful.  Remember that  medication creams can weaken latex condoms. .   HOME CARE:  Good hygiene may prevent some types of vaginosis from recurring and may relieve some symptoms:  Avoid baths, hot tubs and whirlpool spas. Rinse soap from your outer genital area after a shower, and dry the area well to prevent irritation. Don't use scented or harsh soaps, such as those with deodorant or antibacterial action. Avoid irritants. These include scented tampons and pads. Wipe from front to back after using the toilet. Doing so avoids spreading fecal bacteria to your vagina.  Other things that may help prevent vaginosis include:  Don't douche. Your vagina doesn't require cleansing other than normal bathing. Repetitive douching disrupts the normal organisms that reside in the vagina and can actually increase your risk of vaginal infection. Douching won't clear up a vaginal infection. Use a latex condom. Both female and female latex condoms may help you avoid infections spread by sexual contact. Wear cotton underwear. Also wear pantyhose with a cotton crotch. If you feel comfortable without it, skip wearing underwear to bed. Yeast thrives in moist environments Your symptoms should improve in the next day or two.  GET HELP RIGHT AWAY IF:  You have pain in your lower abdomen ( pelvic area or over your ovaries) You develop nausea or vomiting You develop a fever Your discharge changes or worsens You have persistent pain with intercourse You develop shortness of breath, a rapid pulse, or you faint.  These symptoms could be signs of problems or infections that need to be evaluated by a medical provider now.  MAKE SURE YOU   Understand these instructions. Will watch your condition. Will get help right   away if you are not doing well or get worse.  Thank you for choosing an e-visit.  Your e-visit answers were reviewed by a board certified advanced clinical practitioner to complete your personal care plan. Depending upon the  condition, your plan could have included both over the counter or prescription medications.  Please review your pharmacy choice. Make sure the pharmacy is open so you can pick up prescription now. If there is a problem, you may contact your provider through MyChart messaging and have the prescription routed to another pharmacy.  Your safety is important to us. If you have drug allergies check your prescription carefully.   For the next 24 hours you can use MyChart to ask questions about today's visit, request a non-urgent call back, or ask for a work or school excuse. You will get an email in the next two days asking about your experience. I hope that your e-visit has been valuable and will speed your recovery.   Time spent with patient today was 5-10 minutes which consisted of chart review, discussing diagnosis, work up, treatment answering questions and documentation.  Landon Truax PA-C  

## 2021-12-29 ENCOUNTER — Telehealth: Payer: Self-pay | Admitting: Urgent Care

## 2021-12-29 DIAGNOSIS — N760 Acute vaginitis: Secondary | ICD-10-CM

## 2021-12-29 MED ORDER — FLUCONAZOLE 150 MG PO TABS: 150.0000 mg | ORAL_TABLET | Freq: Once | ORAL | 0 refills | Status: AC

## 2021-12-29 MED ORDER — METRONIDAZOLE 500 MG PO TABS: 500.0000 mg | ORAL_TABLET | Freq: Two times a day (BID) | ORAL | 0 refills | Status: AC

## 2021-12-29 NOTE — Progress Notes (Signed)
E-Visit for Vaginal Symptoms  We are sorry that you are not feeling well. Here is how we plan to help! Based on what you shared with me it looks like you: May have a vaginosis due to bacteria  Vaginosis is an inflammation of the vagina that can result in discharge, itching and pain. The cause is usually a change in the normal balance of vaginal bacteria or an infection. Vaginosis can also result from reduced estrogen levels after menopause.  The most common causes of vaginosis are:   Bacterial vaginosis which results from an overgrowth of one on several organisms that are normally present in your vagina.   Yeast infections which are caused by a naturally occurring fungus called candida.   Vaginal atrophy (atrophic vaginosis) which results from the thinning of the vagina from reduced estrogen levels after menopause.   Trichomoniasis which is caused by a parasite and is commonly transmitted by sexual intercourse.  Factors that increase your risk of developing vaginosis include: Medications, such as antibiotics and steroids Uncontrolled diabetes Use of hygiene products such as bubble bath, vaginal spray or vaginal deodorant Douching Wearing damp or tight-fitting clothing Using an intrauterine device (IUD) for birth control Hormonal changes, such as those associated with pregnancy, birth control pills or menopause Sexual activity Having a sexually transmitted infection  Your treatment plan is Metronidazole or Flagyl 500mg  twice a day for 7 days.  I have electronically sent this prescription into the pharmacy that you have chosen. I have also sent in a single tablet of diflucan 150mg . Take this in a single dose.  Be sure to take all of the medication as directed. Stop taking any medication if you develop a rash, tongue swelling or shortness of breath. Mothers who are breast feeding should consider pumping and discarding their breast milk while on these antibiotics. However, there is no  consensus that infant exposure at these doses would be harmful.  Remember that medication creams can weaken latex condoms.   HOME CARE:  Good hygiene may prevent some types of vaginosis from recurring and may relieve some symptoms:  Avoid baths, hot tubs and whirlpool spas. Rinse soap from your outer genital area after a shower, and dry the area well to prevent irritation. Don't use scented or harsh soaps, such as those with deodorant or antibacterial action. Avoid irritants. These include scented tampons and pads. Wipe from front to back after using the toilet. Doing so avoids spreading fecal bacteria to your vagina.  Other things that may help prevent vaginosis include:  Don't douche. Your vagina doesn't require cleansing other than normal bathing. Repetitive douching disrupts the normal organisms that reside in the vagina and can actually increase your risk of vaginal infection. Douching won't clear up a vaginal infection. Use a latex condom. Both female and female latex condoms may help you avoid infections spread by sexual contact. Wear cotton underwear. Also wear pantyhose with a cotton crotch. If you feel comfortable without it, skip wearing underwear to bed. Yeast thrives in Your symptoms should improve in the next day or two.  GET HELP RIGHT AWAY IF:  You have pain in your lower abdomen ( pelvic area or over your ovaries) You develop nausea or vomiting You develop a fever Your discharge changes or worsens You have persistent pain with intercourse You develop shortness of breath, a rapid pulse, or you faint.  These symptoms could be signs of problems or infections that need to be evaluated by a medical provider now.  MAKE SURE YOU   Understand these instructions. Will watch your condition. Will get help right away if you are not doing well or get worse.  Thank you for choosing an e-visit.  Your e-visit answers were reviewed by a board certified  advanced clinical practitioner to complete your personal care plan. Depending upon the condition, your plan could have included both over the counter or prescription medications.  Please review your pharmacy choice. Make sure the pharmacy is open so you can pick up prescription now. If there is a problem, you may contact your provider through [employer] and have the prescription routed to another pharmacy.  Your safety is important to [place]. If you have drug allergies check your prescription carefully.   For the next 24 hours you can use MyChart to ask questions about today's visit, request a non-urgent call back, or ask for a work or school excuse. You will get an email in the next two days asking about your experience. I hope that your e-visit has been valuable and will speed your recovery.   I have spent 5 minutes in review of e-visit questionnaire, review and updating patient chart, medical decision making and response to patient.   Aarit Kashuba L Sreshta Cressler, PA

## 2022-01-17 ENCOUNTER — Ambulatory Visit: Payer: Medicaid Other

## 2022-03-13 ENCOUNTER — Telehealth: Payer: Self-pay | Admitting: Physician Assistant

## 2022-03-13 DIAGNOSIS — B379 Candidiasis, unspecified: Secondary | ICD-10-CM

## 2022-03-13 DIAGNOSIS — N760 Acute vaginitis: Secondary | ICD-10-CM

## 2022-03-13 DIAGNOSIS — B9689 Other specified bacterial agents as the cause of diseases classified elsewhere: Secondary | ICD-10-CM

## 2022-03-13 DIAGNOSIS — T3695XA Adverse effect of unspecified systemic antibiotic, initial encounter: Secondary | ICD-10-CM

## 2022-03-13 MED ORDER — METRONIDAZOLE 500 MG PO TABS: 500.0000 mg | ORAL_TABLET | Freq: Two times a day (BID) | ORAL | 0 refills | Status: AC

## 2022-03-13 MED ORDER — FLUCONAZOLE 150 MG PO TABS: 150.0000 mg | ORAL_TABLET | ORAL | 0 refills | Status: DC | PRN

## 2022-03-13 NOTE — Progress Notes (Signed)
E-Visit for Vaginal Symptoms  We are sorry that you are not feeling well. Here is how we plan to help! Based on what you shared with me it looks like you: May have a vaginosis due to bacteria  Vaginosis is an inflammation of the vagina that can result in discharge, itching and pain. The cause is usually a change in the normal balance of vaginal bacteria or an infection. Vaginosis can also result from reduced estrogen levels after menopause.  The most common causes of vaginosis are:   Bacterial vaginosis which results from an overgrowth of one on several organisms that are normally present in your vagina.   Yeast infections which are caused by a naturally occurring fungus called candida.   Vaginal atrophy (atrophic vaginosis) which results from the thinning of the vagina from reduced estrogen levels after menopause.   Trichomoniasis which is caused by a parasite and is commonly transmitted by sexual intercourse.  Factors that increase your risk of developing vaginosis include: Medications, such as antibiotics and steroids Uncontrolled diabetes Use of hygiene products such as bubble bath, vaginal spray or vaginal deodorant Douching Wearing damp or tight-fitting clothing Using an intrauterine device (IUD) for birth control Hormonal changes, such as those associated with pregnancy, birth control pills or menopause Sexual activity Having a sexually transmitted infection  Your treatment plan is Metronidazole or Flagyl 500mg  twice a day for 7 days.  I have electronically sent this prescription into the pharmacy that you have chosen. I have also prescribed Fluconazole (Diflucan) for possible yeast infection.   Be sure to take all of the medication as directed. Stop taking any medication if you develop a rash, tongue swelling or shortness of breath. Mothers who are breast feeding should consider pumping and discarding their breast milk while on these antibiotics. However, there is no consensus  that infant exposure at these doses would be harmful.  Remember that medication creams can weaken latex condoms. [place] [employer]   HOME CARE:  Good hygiene may prevent some types of vaginosis from recurring and may relieve some symptoms:  Avoid baths, hot tubs and whirlpool spas. Rinse soap from your outer genital area after a shower, and dry the area well to prevent irritation. Don't use scented or harsh soaps, such as those with deodorant or antibacterial action. Avoid irritants. These include scented tampons and pads. Wipe from front to back after using the toilet. Doing so avoids spreading fecal bacteria to your vagina.  Other things that may help prevent vaginosis include:  Don't douche. Your vagina doesn't require cleansing other than normal bathing. Repetitive douching disrupts the normal organisms that reside in the vagina and can actually increase your risk of vaginal infection. Douching won't clear up a vaginal infection. Use a latex condom. Both female and female latex condoms may help you avoid infections spread by sexual contact. Wear cotton underwear. Also wear pantyhose with a cotton crotch. If you feel comfortable without it, skip wearing underwear to bed. Yeast thrives in [employer] Your symptoms should improve in the next day or two.  GET HELP RIGHT AWAY IF:  You have pain in your lower abdomen ( pelvic area or over your ovaries) You develop nausea or vomiting You develop a fever Your discharge changes or worsens You have persistent pain with intercourse You develop shortness of breath, a rapid pulse, or you faint.  These symptoms could be signs of problems or infections that need to be evaluated by a medical provider now.  MAKE SURE YOU  Understand these instructions. Will watch your condition. Will get help right away if you are not doing well or get worse.  Thank you for choosing an e-visit.  Your e-visit answers were reviewed by a board certified advanced  clinical practitioner to complete your personal care plan. Depending upon the condition, your plan could have included both over the counter or prescription medications.  Please review your pharmacy choice. Make sure the pharmacy is open so you can pick up prescription now. If there is a problem, you may contact your provider through [employer] and have the prescription routed to another pharmacy.  Your safety is important to [place]. If you have drug allergies check your prescription carefully.   For the next 24 hours you can use MyChart to ask questions about today's visit, request a non-urgent call back, or ask for a work or school excuse. You will get an email in the next two days asking about your experience. I hope that your e-visit has been valuable and will speed your recovery.  I have spent 5 minutes in review of e-visit questionnaire, review and updating patient chart, medical decision making and response to patient.   Margaretann Loveless, PA-C

## 2022-05-26 ENCOUNTER — Telehealth: Payer: BC Managed Care – PPO | Admitting: Physician Assistant

## 2022-05-26 DIAGNOSIS — B9689 Other specified bacterial agents as the cause of diseases classified elsewhere: Secondary | ICD-10-CM | POA: Diagnosis not present

## 2022-05-26 DIAGNOSIS — N760 Acute vaginitis: Secondary | ICD-10-CM

## 2022-05-26 DIAGNOSIS — B3731 Acute candidiasis of vulva and vagina: Secondary | ICD-10-CM

## 2022-05-26 MED ORDER — FLUCONAZOLE 150 MG PO TABS: 150.0000 mg | ORAL_TABLET | ORAL | 0 refills | Status: DC | PRN

## 2022-05-26 MED ORDER — METRONIDAZOLE 0.75 % VA GEL: 1.0000 | Freq: Two times a day (BID) | VAGINAL | 0 refills | Status: DC

## 2022-05-26 NOTE — Progress Notes (Signed)
E-Visit for Vaginal Symptoms  We are sorry that you are not feeling well. Here is how we plan to help! Based on what you shared with me it looks like you: May have a vaginosis due to bacteria  Vaginosis is an inflammation of the vagina that can result in discharge, itching and pain. The cause is usually a change in the normal balance of vaginal bacteria or an infection. Vaginosis can also result from reduced estrogen levels after menopause.  The most common causes of vaginosis are:   Bacterial vaginosis which results from an overgrowth of one on several organisms that are normally present in your vagina.   Yeast infections which are caused by a naturally occurring fungus called candida.   Vaginal atrophy (atrophic vaginosis) which results from the thinning of the vagina from reduced estrogen levels after menopause.   Trichomoniasis which is caused by a parasite and is commonly transmitted by sexual intercourse.  Factors that increase your risk of developing vaginosis include: Medications, such as antibiotics and steroids Uncontrolled diabetes Use of hygiene products such as bubble bath, vaginal spray or vaginal deodorant Douching Wearing damp or tight-fitting clothing Using an intrauterine device (IUD) for birth control Hormonal changes, such as those associated with pregnancy, birth control pills or menopause Sexual activity Having a sexually transmitted infection  Your treatment plan is Metronidazole vag gel 0.75% Use applicatorful twice daily for 7 days. Fluconazole 150mg  Take 1 tablet once, may repeat in 72 hours if needed.   Be sure to take all of the medication as directed. Stop taking any medication if you develop a rash, tongue swelling or shortness of breath. Mothers who are breast feeding should consider pumping and discarding their breast milk while on these antibiotics. However, there is no consensus that infant exposure at these doses would be harmful.  Remember that  medication creams can weaken latex condoms.   HOME CARE:  Good hygiene may prevent some types of vaginosis from recurring and may relieve some symptoms:  Avoid baths, hot tubs and whirlpool spas. Rinse soap from your outer genital area after a shower, and dry the area well to prevent irritation. Don't use scented or harsh soaps, such as those with deodorant or antibacterial action. Avoid irritants. These include scented tampons and pads. Wipe from front to back after using the toilet. Doing so avoids spreading fecal bacteria to your vagina.  Other things that may help prevent vaginosis include:  Don't douche. Your vagina doesn't require cleansing other than normal bathing. Repetitive douching disrupts the normal organisms that reside in the vagina and can actually increase your risk of vaginal infection. Douching won't clear up a vaginal infection. Use a latex condom. Both female and female latex condoms may help you avoid infections spread by sexual contact. Wear cotton underwear. Also wear pantyhose with a cotton crotch. If you feel comfortable without it, skip wearing underwear to bed. Yeast thrives in [employer] Your symptoms should improve in the next day or two.  GET HELP RIGHT AWAY IF:  You have pain in your lower abdomen ( pelvic area or over your ovaries) You develop nausea or vomiting You develop a fever Your discharge changes or worsens You have persistent pain with intercourse You develop shortness of breath, a rapid pulse, or you faint.  These symptoms could be signs of problems or infections that need to be evaluated by a medical provider now.  MAKE SURE YOU   Understand these instructions. Will watch your condition. Will get help  right away if you are not doing well or get worse.  Thank you for choosing an e-visit.  Your e-visit answers were reviewed by a board certified advanced clinical practitioner to complete your personal care plan. Depending upon the  condition, your plan could have included both over the counter or prescription medications.  Please review your pharmacy choice. Make sure the pharmacy is open so you can pick up prescription now. If there is a problem, you may contact your provider through [employer] and have the prescription routed to another pharmacy.  Your safety is important to [place]. If you have drug allergies check your prescription carefully.   For the next 24 hours you can use MyChart to ask questions about today's visit, request a non-urgent call back, or ask for a work or school excuse. You will get an email in the next two days asking about your experience. I hope that your e-visit has been valuable and will speed your recovery.  I have spent 5 minutes in review of e-visit questionnaire, review and updating patient chart, medical decision making and response to patient.   Mar Daring, PA-C

## 2022-05-29 ENCOUNTER — Encounter: Payer: Self-pay | Admitting: Obstetrics & Gynecology

## 2022-05-29 ENCOUNTER — Ambulatory Visit (INDEPENDENT_AMBULATORY_CARE_PROVIDER_SITE_OTHER): Payer: BC Managed Care – PPO | Admitting: Obstetrics & Gynecology

## 2022-05-29 ENCOUNTER — Other Ambulatory Visit (HOSPITAL_COMMUNITY)
Admission: RE | Admit: 2022-05-29 | Discharge: 2022-05-29 | Disposition: A | Discharge status: Home / Self Care | Payer: BC Managed Care – PPO | Source: Ambulatory Visit | Attending: Obstetrics & Gynecology | Admitting: Obstetrics & Gynecology

## 2022-05-29 VITALS — BP 104/70 | HR 70 | Ht 65.75 in | Wt 198.0 lb

## 2022-05-29 DIAGNOSIS — Z3169 Encounter for other general counseling and advice on procreation: Secondary | ICD-10-CM

## 2022-05-29 DIAGNOSIS — Z113 Encounter for screening for infections with a predominantly sexual mode of transmission: Secondary | ICD-10-CM | POA: Insufficient documentation

## 2022-05-29 DIAGNOSIS — Z01419 Encounter for gynecological examination (general) (routine) without abnormal findings: Secondary | ICD-10-CM

## 2022-05-29 DIAGNOSIS — N760 Acute vaginitis: Secondary | ICD-10-CM

## 2022-05-29 DIAGNOSIS — N898 Other specified noninflammatory disorders of vagina: Secondary | ICD-10-CM

## 2022-05-29 LAB — WET PREP FOR TRICH, YEAST, CLUE

## 2022-05-29 MED ORDER — TINIDAZOLE 500 MG PO TABS: 1000.0000 mg | ORAL_TABLET | Freq: Two times a day (BID) | ORAL | 0 refills | Status: AC

## 2022-05-29 NOTE — Progress Notes (Signed)
Bonnie Arnold 1991-01-08 [id number]   History:    32 y.o. G0 Stable partner  RP:  New patient presenting for annual gyn exam   HPI: Normal regular menses every month.  No BTB.  No pelvic pain.  C/O vaginal discharge with odor.  Treated for yeast vaginitis with Fluconazole recently.  Was also prescribed Metrogel but didn't take it d/t cost.  Just started attempting conception.  Timed IC.  No h/o STIs, except abnormal Pap in 2019, Colpo Negative.  Pap reflex today.  Requests full STI screen.  Breasts normal.  BMI 32.2.    PAP: 2019 Pt c/o clear vaginal discharge, vaginal itching Flu vaccine declined   Past medical history,surgical history, family history and social history were all reviewed and documented in the EPIC chart.  Gynecologic History Patient's last menstrual period was 05/13/2022 (exact date).  Obstetric History OB History  Gravida Para Term Preterm AB Living  0 0 0 0 0 0  SAB IAB Ectopic Multiple Live Births  0 0 0 0 0     ROS: A ROS was performed and pertinent positives and negatives are included in the history. GENERAL: No fevers or chills. HEENT: No change in vision, no earache, sore throat or sinus congestion. NECK: No pain or stiffness. CARDIOVASCULAR: No chest pain or pressure. No palpitations. PULMONARY: No shortness of breath, cough or wheeze. GASTROINTESTINAL: No abdominal pain, nausea, vomiting or diarrhea, melena or bright red blood per rectum. GENITOURINARY: No urinary frequency, urgency, hesitancy or dysuria. MUSCULOSKELETAL: No joint or muscle pain, no back pain, no recent trauma. DERMATOLOGIC: No rash, no itching, no lesions. ENDOCRINE: No polyuria, polydipsia, no heat or cold intolerance. No recent change in weight. HEMATOLOGICAL: No anemia or easy bruising or bleeding. NEUROLOGIC: No headache, seizures, numbness, tingling or weakness. PSYCHIATRIC: No depression, no loss of interest in normal activity or change in sleep pattern.     Exam:   BP 104/70    Pulse 70   Ht 5' 5.75" (1.67 m)   Wt 198 lb (89.8 kg)   LMP 05/13/2022 (Exact Date)   SpO2 98%   BMI 32.20 kg/m   Body mass index is 32.2 kg/m.  General appearance : Well developed well nourished female. No acute distress HEENT: Eyes: no retinal hemorrhage or exudates,  Neck supple, trachea midline, no carotid bruits, no thyroidmegaly Lungs: Clear to auscultation, no rhonchi or wheezes, or rib retractions  Heart: Regular rate and rhythm, no murmurs or gallops Breast:Examined in sitting and supine position were symmetrical in appearance, no palpable masses or tenderness,  no skin retraction, no nipple inversion, no nipple discharge, no skin discoloration, no axillary or supraclavicular lymphadenopathy Abdomen: no palpable masses or tenderness, no rebound or guarding Extremities: no edema or skin discoloration or tenderness  Pelvic: Vulva: Normal             Vagina: No gross lesions.  Increased discharge.  Wet prep done.  Cervix: No gross lesions or discharge.  Pap reflex/Gono-Chlam done.  Uterus  AV, normal size, shape and consistency, non-tender and mobile  Adnexa  Without masses or tenderness  Anus: Normal  Wet Prep:  Clue cells with odor present   Assessment/Plan:  32 y.o. female for annual exam   1. Encounter for routine gynecological examination with Papanicolaou smear of cervix Normal regular menses every month.  No BTB.  No pelvic pain.  C/O vaginal discharge with odor.  Treated for yeast vaginitis with Fluconazole recently.  Was also prescribed Metrogel but didn't take  it d/t cost.  Just started attempting conception.  Timed IC.  No h/o STIs, except abnormal Pap in 2019, Colpo Negative.  Pap reflex today.  Requests full STI screen.  Breasts normal.  BMI 32.2.   - Cytology - PAP( [place])  2. Encounter for preconception consultation Healthy nutrition and fitness activities to continue.  Recommend to start on a PNV.  Timed IC discussed.  Will consult for Fertility if not  successful at 1 year.  3. Screen for STD (sexually transmitted disease) - HIV antibody (with reflex) - RPR - Hepatitis B Surface AntiGEN - Hepatitis C Antibody - Cytology - PAP( [place])- Gono-Chlam  4. Vaginal discharge BV confirmed by Wet prep.  Will treat with Tinidazole.  Usage reviewed, no alcohol.  Prescription sent to pharmacy. - WET PREP FOR [place], YEAST, CLUE   Other orders - tinidazole (TINDAMAX) 500 MG tablet; Take 2 tablets (1,000 mg total) by mouth 2 (two) times daily for 2 days.   Princess Bruins MD, 12:05 PM

## 2022-05-30 ENCOUNTER — Telehealth: Payer: BC Managed Care – PPO | Admitting: Nurse Practitioner

## 2022-05-30 DIAGNOSIS — M545 Low back pain, unspecified: Secondary | ICD-10-CM | POA: Diagnosis not present

## 2022-05-30 LAB — HIV ANTIBODY (ROUTINE TESTING W REFLEX): HIV 1&2 Ab, 4th Generation: NONREACTIVE

## 2022-05-30 LAB — HEPATITIS C ANTIBODY: Hepatitis C Ab: NONREACTIVE

## 2022-05-30 LAB — RPR: RPR Ser Ql: NONREACTIVE

## 2022-05-30 LAB — HEPATITIS B SURFACE ANTIGEN: Hepatitis B Surface Ag: NONREACTIVE

## 2022-05-30 MED ORDER — CYCLOBENZAPRINE HCL 10 MG PO TABS: 10.0000 mg | ORAL_TABLET | Freq: Three times a day (TID) | ORAL | 0 refills | Status: DC | PRN

## 2022-05-30 MED ORDER — NAPROXEN 500 MG PO TABS: 500.0000 mg | ORAL_TABLET | Freq: Two times a day (BID) | ORAL | 0 refills | Status: AC

## 2022-05-30 NOTE — Progress Notes (Signed)
We are sorry that you are not feeling well.  Here is how we plan to help!  Based on what you have shared with me it looks like you mostly have acute back pain.  Acute back pain is defined as musculoskeletal pain that can resolve in 1-3 weeks with conservative treatment.  I have prescribed Naprosyn 500 mg take one by mouth twice a day non-steroid anti-inflammatory (NSAID) as well as Flexeril 10 mg every eight hours as needed which is a muscle relaxer *   Be sure to take Naprosyn with food  *Do not take Flexeril before work, driving or operating machinery.  Some patients experience stomach irritation or in increased heartburn with anti-inflammatory drugs.  Please keep in mind that muscle relaxer's can cause fatigue and should not be taken while at work or driving.  Back pain is very common.  The pain often gets better over time.  The cause of back pain is usually not dangerous.  Most people can learn to manage their back pain on their own.  Home Care Stay active.  Start with short walks on flat ground if you can.  Try to walk farther each day. Do not sit, drive or stand in one place for more than 30 minutes.  Do not stay in bed. Do not avoid exercise or work.  Activity can help your back heal faster. Be careful when you bend or lift an object.  Bend at your knees, keep the object close to you, and do not twist. Sleep on a firm mattress.  Lie on your side, and bend your knees.  If you lie on your back, put a pillow under your knees. Only take medicines as told by your doctor. Put ice on the injured area. Put ice in a plastic bag Place a towel between your skin and the bag Leave the ice on for 15-20 minutes, 3-4 times a day for the first 2-3 days. 210 After that, you can switch between ice and heat packs. Ask your doctor about back exercises or massage. Avoid feeling anxious or stressed.  Find good ways to deal with stress, such as exercise.  Get Help Right Way If: Your pain does not go away  with rest or medicine. Your pain does not go away in 1 week. You have new problems. You do not feel well. The pain spreads into your legs. You cannot control when you poop (bowel movement) or pee (urinate) You feel sick to your stomach (nauseous) or throw up (vomit) You have belly (abdominal) pain. You feel like you may pass out (faint). If you develop a fever.  Make Sure you: Understand these instructions. Will watch your condition Will get help right away if you are not doing well or get worse.  Your e-visit answers were reviewed by a board certified advanced clinical practitioner to complete your personal care plan.  Depending on the condition, your plan could have included both over the counter or prescription medications.  If there is a problem please reply  once you have received a response from your provider.  Your safety is important to [place].  If you have drug allergies check your prescription carefully.    You can use MyChart to ask questions about today's visit, request a non-urgent call back, or ask for a work or school excuse for 24 hours related to this e-Visit. If it has been greater than 24 hours you will need to follow up with your provider, or enter a new e-Visit to address  those concerns.  You will get an e-mail in the next two days asking about your experience.  I hope that your e-visit has been valuable and will speed your recovery. Thank you for using e-visits.   Meds ordered this encounter  Medications   naproxen (NAPROSYN) 500 MG tablet    Sig: Take 1 tablet (500 mg total) by mouth 2 (two) times daily with a meal for 7 days.    Dispense:  14 tablet    Refill:  0   cyclobenzaprine (FLEXERIL) 10 MG tablet    Sig: Take 1 tablet (10 mg total) by mouth 3 (three) times daily as needed for muscle spasms.    Dispense:  30 tablet    Refill:  0     I spent approximately 5 minutes reviewing the patient's history, current symptoms and coordinating their care today.

## 2022-06-04 LAB — CYTOLOGY - PAP
Chlamydia: NEGATIVE
Comment: NEGATIVE
Comment: NEGATIVE
Comment: NORMAL
Diagnosis: UNDETERMINED — AB
High risk HPV: POSITIVE — AB
Neisseria Gonorrhea: NEGATIVE

## 2022-06-09 ENCOUNTER — Telehealth: Payer: BC Managed Care – PPO | Admitting: Nurse Practitioner

## 2022-06-09 DIAGNOSIS — J069 Acute upper respiratory infection, unspecified: Secondary | ICD-10-CM | POA: Diagnosis not present

## 2022-06-09 MED ORDER — FLUTICASONE PROPIONATE 50 MCG/ACT NA SUSP: 2.0000 | Freq: Every day | NASAL | 6 refills | Status: DC

## 2022-06-09 MED ORDER — BENZONATATE 100 MG PO CAPS: 100.0000 mg | ORAL_CAPSULE | Freq: Three times a day (TID) | ORAL | 0 refills | Status: DC | PRN

## 2022-06-09 NOTE — Progress Notes (Signed)
E-Visit for Upper Respiratory Infection   We are sorry you are not feeling well.  Here is how we plan to help!  Based on what you have shared with me, it looks like you may have a viral upper respiratory infection.  Upper respiratory infections are caused by a large number of viruses; however, rhinovirus is the most common cause. This may have been flu as well, though we do not recommend Tamiflu (anti-viraL) after 48 hours of symptoms.   Symptoms vary from person to person, with common symptoms including sore throat, cough, fatigue or lack of energy and feeling of general discomfort.  A low-grade fever of up to 100.4 may present, but is often uncommon.  Symptoms vary however, and are closely related to a person's age or underlying illnesses.  The most common symptoms associated with an upper respiratory infection are nasal discharge or congestion, cough, sneezing, headache and pressure in the ears and face.  These symptoms usually persist for about 3 to 10 days, but can last up to 2 weeks.  It is important to know that upper respiratory infections do not cause serious illness or complications in most cases.    Upper respiratory infections can be transmitted from person to person, with the most common method of transmission being a person's hands.  The virus is able to live on the skin and can infect other persons for up to 2 hours after direct contact.  Also, these can be transmitted when someone coughs or sneezes; thus, it is important to cover the mouth to reduce this risk.  To keep the spread of the illness at [place], good hand hygiene is very important.  This is an infection that is most likely caused by a virus. There are no specific treatments other than to help you with the symptoms until the infection runs its course.  We are sorry you are not feeling well.  Here is how we plan to help!   For nasal congestion, you may use an oral decongestants such as Mucinex D or if you have glaucoma or high blood  pressure use plain Mucinex.  Saline nasal spray or nasal drops can help and can safely be used as often as needed for congestion.  For your congestion, I have prescribed Fluticasone nasal spray one spray in each nostril twice a day  If you do not have a history of heart disease, hypertension, diabetes or thyroid disease, prostate/bladder issues or glaucoma, you may also use Sudafed to treat nasal congestion.  It is highly recommended that you consult with a pharmacist or your primary care physician to ensure this medication is safe for you to take.     If you have a cough, you may use cough suppressants such as Delsym and Robitussin.  If you have glaucoma or high blood pressure, you can also use Coricidin HBP.   For cough I have prescribed for you A prescription cough medication called Tessalon Perles 100 mg. You may take 1-2 capsules every 8 hours as needed for cough  If you have a sore or scratchy throat, use a saltwater gargle-  to  teaspoon of salt dissolved in a 4-ounce to 8-ounce glass of warm water.  Gargle the solution for approximately 15-30 seconds and then spit.  It is important not to swallow the solution.  You can also use throat lozenges/cough drops and Chloraseptic spray to help with throat pain or discomfort.  Warm or cold liquids can also be helpful in relieving throat pain.  For headache, pain or general discomfort, you can use Ibuprofen or Tylenol as directed.   Some authorities believe that zinc sprays or the use of Echinacea may shorten the course of your symptoms.   HOME CARE Only take medications as instructed by your medical team. Be sure to drink plenty of fluids. Water is fine as well as fruit juices, sodas and electrolyte beverages. You may want to stay away from caffeine or alcohol. If you are nauseated, try taking small sips of liquids. How do you know if you are getting enough fluid? Your urine should be a pale yellow or almost colorless. Get rest. Taking a steamy  shower or using a humidifier may help nasal congestion and ease sore throat pain. You can place a towel over your head and breathe in the steam from hot water coming from a faucet. Using a saline nasal spray works much the same way. Cough drops, hard candies and sore throat lozenges may ease your cough. Avoid close contacts especially the very young and the elderly Cover your mouth if you cough or sneeze Always remember to wash your hands.   GET HELP RIGHT AWAY IF: You develop worsening fever. If your symptoms do not improve within 10 days You develop yellow or green discharge from your nose over 3 days. You have coughing fits You develop a severe head ache or visual changes. You develop shortness of breath, difficulty breathing or start having chest pain Your symptoms persist after you have completed your treatment plan  MAKE SURE YOU  Understand these instructions. Will watch your condition. Will get help right away if you are not doing well or get worse.  Thank you for choosing an e-visit.  Your e-visit answers were reviewed by a board certified advanced clinical practitioner to complete your personal care plan. Depending upon the condition, your plan could have included both over the counter or prescription medications.  Please review your pharmacy choice. Make sure the pharmacy is open so you can pick up prescription now. If there is a problem, you may contact your provider through [employer] and have the prescription routed to another pharmacy.  Your safety is important to [place]. If you have drug allergies check your prescription carefully.   For the next 24 hours you can use MyChart to ask questions about today's visit, request a non-urgent call back, or ask for a work or school excuse. You will get an email in the next two days asking about your experience. I hope that your e-visit has been valuable and will speed your recovery.  Meds ordered this encounter  Medications    fluticasone (FLONASE) 50 MCG/ACT nasal spray    Sig: Place 2 sprays into both nostrils daily.    Dispense:  16 g    Refill:  6   benzonatate (TESSALON) 100 MG capsule    Sig: Take 1 capsule (100 mg total) by mouth 3 (three) times daily as needed.    Dispense:  30 capsule    Refill:  0    I spent approximately 5 minutes reviewing the patient's history, current symptoms and coordinating their care today.

## 2022-07-04 ENCOUNTER — Other Ambulatory Visit: Payer: Self-pay

## 2022-07-04 DIAGNOSIS — R87610 Atypical squamous cells of undetermined significance on cytologic smear of cervix (ASC-US): Secondary | ICD-10-CM

## 2022-07-11 ENCOUNTER — Telehealth: Payer: BC Managed Care – PPO | Admitting: Nurse Practitioner

## 2022-07-11 DIAGNOSIS — N760 Acute vaginitis: Secondary | ICD-10-CM | POA: Diagnosis not present

## 2022-07-11 DIAGNOSIS — B9689 Other specified bacterial agents as the cause of diseases classified elsewhere: Secondary | ICD-10-CM | POA: Diagnosis not present

## 2022-07-12 ENCOUNTER — Other Ambulatory Visit (INDEPENDENT_AMBULATORY_CARE_PROVIDER_SITE_OTHER): Payer: Self-pay | Admitting: Physician Assistant

## 2022-07-12 DIAGNOSIS — B3731 Acute candidiasis of vulva and vagina: Secondary | ICD-10-CM

## 2022-07-12 MED ORDER — METRONIDAZOLE 500 MG PO TABS: 500.0000 mg | ORAL_TABLET | Freq: Two times a day (BID) | ORAL | 0 refills | Status: DC

## 2022-07-12 NOTE — Progress Notes (Signed)
E-Visit for Vaginal Symptoms  We are sorry that you are not feeling well. Here is how we plan to help! Based on what you shared with me it looks like you: May have a vaginosis due to bacteria  We cannot continue to treat you for this. Tasia Catchings have had this several times in the last few months. You must see a GYN after this for treatment. Need to try to determine the cause of this.  Vaginosis is an inflammation of the vagina that can result in discharge, itching and pain. The cause is usually a change in the normal balance of vaginal bacteria or an infection. Vaginosis can also result from reduced estrogen levels after menopause.  The most common causes of vaginosis are:   Bacterial vaginosis which results from an overgrowth of one on several organisms that are normally present in your vagina.   Yeast infections which are caused by a naturally occurring fungus called candida.   Vaginal atrophy (atrophic vaginosis) which results from the thinning of the vagina from reduced estrogen levels after menopause.   Trichomoniasis which is caused by a parasite and is commonly transmitted by sexual intercourse.  Factors that increase your risk of developing vaginosis include: Medications, such as antibiotics and steroids Uncontrolled diabetes Use of hygiene products such as bubble bath, vaginal spray or vaginal deodorant Douching Wearing damp or tight-fitting clothing Using an intrauterine device (IUD) for birth control Hormonal changes, such as those associated with pregnancy, birth control pills or menopause Sexual activity Having a sexually transmitted infection  Your treatment plan is Metronidazole or Flagyl '500mg'$  twice a day for 7 days.  I have electronically sent this prescription into the pharmacy that you have chosen.  Be sure to take all of the medication as directed. Stop taking any medication if you develop a rash, tongue swelling or shortness of breath. Mothers who are breast feeding  should consider pumping and discarding their breast milk while on these antibiotics. However, there is no consensus that infant exposure at these doses would be harmful.  Remember that medication creams can weaken latex condoms. Marland Kitchen   HOME CARE:  Good hygiene may prevent some types of vaginosis from recurring and may relieve some symptoms:  Avoid baths, hot tubs and whirlpool spas. Rinse soap from your outer genital area after a shower, and dry the area well to prevent irritation. Don't use scented or harsh soaps, such as those with deodorant or antibacterial action. Avoid irritants. These include scented tampons and pads. Wipe from front to back after using the toilet. Doing so avoids spreading fecal bacteria to your vagina.  Other things that may help prevent vaginosis include:  Don't douche. Your vagina doesn't require cleansing other than normal bathing. Repetitive douching disrupts the normal organisms that reside in the vagina and can actually increase your risk of vaginal infection. Douching won't clear up a vaginal infection. Use a latex condom. Both female and female latex condoms may help you avoid infections spread by sexual contact. Wear cotton underwear. Also wear pantyhose with a cotton crotch. If you feel comfortable without it, skip wearing underwear to bed. Yeast thrives in [employer] Your symptoms should improve in the next day or two.  GET HELP RIGHT AWAY IF:  You have pain in your lower abdomen ( pelvic area or over your ovaries) You develop nausea or vomiting You develop a fever Your discharge changes or worsens You have persistent pain with intercourse You develop shortness of breath, a rapid pulse, or you  faint.  These symptoms could be signs of problems or infections that need to be evaluated by a medical provider now.  MAKE SURE YOU   Understand these instructions. Will watch your condition. Will get help right away if you are not doing well or get  worse.  Thank you for choosing an e-visit.  Your e-visit answers were reviewed by a board certified advanced clinical practitioner to complete your personal care plan. Depending upon the condition, your plan could have included both over the counter or prescription medications.  Please review your pharmacy choice. Make sure the pharmacy is open so you can pick up prescription now. If there is a problem, you may contact your provider through [employer] and have the prescription routed to another pharmacy.  Your safety is important to [place]. If you have drug allergies check your prescription carefully.   For the next 24 hours you can use MyChart to ask questions about today's visit, request a non-urgent call back, or ask for a work or school excuse. You will get an email in the next two days asking about your experience. I hope that your e-visit has been valuable and will speed your recovery.  Mary-Margaret Hassell Done, FNP   5-10 minutes spent reviewing and documenting in chart.

## 2022-08-11 ENCOUNTER — Other Ambulatory Visit (INDEPENDENT_AMBULATORY_CARE_PROVIDER_SITE_OTHER): Payer: Self-pay | Admitting: Physician Assistant

## 2022-08-11 ENCOUNTER — Telehealth: Payer: Self-pay | Admitting: Physician Assistant

## 2022-08-11 DIAGNOSIS — B3731 Acute candidiasis of vulva and vagina: Secondary | ICD-10-CM

## 2022-08-11 MED ORDER — FLUCONAZOLE 150 MG PO TABS: 150.0000 mg | ORAL_TABLET | ORAL | 0 refills | Status: DC | PRN

## 2022-08-11 NOTE — Progress Notes (Signed)

## 2022-08-13 ENCOUNTER — Ambulatory Visit: Payer: BC Managed Care – PPO | Admitting: Obstetrics & Gynecology

## 2022-08-14 ENCOUNTER — Telehealth: Payer: BC Managed Care – PPO | Admitting: Physician Assistant

## 2022-08-14 DIAGNOSIS — N760 Acute vaginitis: Secondary | ICD-10-CM

## 2022-08-15 NOTE — Progress Notes (Signed)
Because of recurrent vaginitis,  I feel your condition warrants further evaluation and I recommend that you be seen in a face to face visit with your gynecologist or at one of our [facility] clinics.   NOTE: There will be NO CHARGE for this eVisit   If you are having a true medical emergency please call 911.    *Center for [employer] at [employer] [facility]             [address] [place], [place] [address] [phone] (*Take patients with no insurance)  *Center for [employer] at [place], [place],  [place]  [address] [phone] (*Take patients with no insurance)  Center for [employer] at [place], [place], [place], [place], [address] [phone]  Center for [facility] at [place], [place], [place], [place], [address] [phone]  Center for [facility] at [facility] [address], [place], [place], [place], [address] [phone]  Center for [facility] at [facility]                                 [place], [place], [place], [address] [phone]  Center for [facility] at [facility]                                    [address] [place], [place], [address] [place] [facility] [place] at [facility] [address], [place], [place], [place], [address]                              [facility] [place], [place], [place], [place], [address] [phone]   For an urgent face to face visit, [place] has eight urgent care centers for your convenience:   NEW!! [place] [facility] [place] at [place] Get Driving Directions [phone] [address] [place], [place] [facility] [place] at [place] Get Driving Directions [id number] [place], [place] [address]   [place] [facility] [place]) Get Driving Directions [phone] [address] [place], [place] [address]  [place] [facility] [place] [facility]) Get Driving Directions [id number] [address] [place],  [place]  [address]  [place] [facility] [place] [facility] - at [place] Get Driving Directions  [phone] [address] [place],  [place] [address]   [place] [facility] at [facility] Get Driving Directions [id number] [place], [place], [place] [address]   [place] [facility] at [facility] Get Driving Directions  [id number] [address]  [place], [place] [address]   [place] [facility] at [employer] Get Driving Directions [id number] [address] [place], [place] [address]  Your MyChart E-visit questionnaire answers were reviewed by a board certified advanced clinical practitioner to complete your personal care plan based on your specific symptoms.  Thank you for using e-Visits.   I have spent 5 minutes in review of e-visit questionnaire, review and updating patient chart, medical decision making and response to patient.   Mar Daring, PA-C

## 2022-09-01 ENCOUNTER — Ambulatory Visit: Payer: Medicaid Other | Admitting: Obstetrics & Gynecology

## 2022-09-10 ENCOUNTER — Telehealth: Payer: Medicaid Other | Admitting: Physician Assistant

## 2022-09-10 DIAGNOSIS — N760 Acute vaginitis: Secondary | ICD-10-CM

## 2022-09-10 NOTE — Progress Notes (Signed)
Because of having recurrent vaginal infections/issues in a short period of time, I feel your condition warrants further evaluation and I recommend that you be seen in a face to face visit.   NOTE: There will be NO CHARGE for this eVisit   If you are having a true medical emergency please call 911.      For an urgent face to face visit, [facility] has eight urgent care centers for your convenience:   NEW!! [facility] Center at [facility] Get Driving Directions [phone] [address] [place], [address]    [facility] at [facility] Get Driving Directions [phone] [address] [place], [place] [address]   [facility] Center [facility]) Get Driving Directions [phone] [address] [place], [place] [address]  [facility] Center [facility]) Get Driving Directions [phone] [address] [place],  [place]  [address]  [facility] Center [facility] - at [employer]  [phone] [place],  [place] [address]   [facility] Care at [facility] Get Driving Directions [phone] [address] [place] [address] [place], [place] [address]   [facility] at [facility] Get Driving Directions  [phone] [address] [place], [place] [address]   [facility] Urgent Care at [facility] Directions [phone] [address] [place], [place] [address]  Your MyChart E-visit questionnaire answers were reviewed by a board certified advanced clinical practitioner to complete your personal care plan based on your specific symptoms.  Thank you for using e-Visits.   I have spent 5 minutes in review of e-visit questionnaire, review and updating patient chart, medical decision making and response to patient.   Margaretann Loveless, PA-C

## 2022-09-13 ENCOUNTER — Other Ambulatory Visit: Payer: Self-pay | Admitting: Physician Assistant

## 2022-09-13 DIAGNOSIS — B3731 Acute candidiasis of vulva and vagina: Secondary | ICD-10-CM

## 2022-09-24 ENCOUNTER — Ambulatory Visit: Payer: BC Managed Care – PPO | Admitting: Obstetrics & Gynecology

## 2022-10-08 ENCOUNTER — Encounter: Payer: Self-pay | Admitting: Obstetrics & Gynecology

## 2022-10-20 ENCOUNTER — Telehealth: Payer: Self-pay | Admitting: Obstetrics & Gynecology

## 2022-10-20 NOTE — Telephone Encounter (Signed)
Patient no show for her May 8 colposcopy appointment. Left message on May 13 & 17 and mailed letter on May 22 for patient to call and rescheduled this appointment. Patient has not rescheduled and this is second time she has no show for colposcopy.

## 2022-11-03 NOTE — Telephone Encounter (Signed)
Call placed to patient, left detailed message, ok per dpr. Advised following-up on recommended colpo and missed appts. Return call to [place], [place] at [place] [phone].

## 2022-11-06 ENCOUNTER — Encounter: Payer: Self-pay | Admitting: Radiology

## 2022-11-06 ENCOUNTER — Ambulatory Visit (INDEPENDENT_AMBULATORY_CARE_PROVIDER_SITE_OTHER): Payer: Self-pay | Admitting: Radiology

## 2022-11-06 VITALS — BP 104/76

## 2022-11-06 DIAGNOSIS — Z113 Encounter for screening for infections with a predominantly sexual mode of transmission: Secondary | ICD-10-CM

## 2022-11-06 DIAGNOSIS — N949 Unspecified condition associated with female genital organs and menstrual cycle: Secondary | ICD-10-CM

## 2022-11-06 DIAGNOSIS — R300 Dysuria: Secondary | ICD-10-CM

## 2022-11-06 LAB — URINALYSIS, COMPLETE W/RFL CULTURE
Ketones, ur: NEGATIVE
Leukocyte Esterase: NEGATIVE
Nitrites, Initial: POSITIVE — AB
WBC, UA: NONE SEEN /HPF (ref 0–5)
pH: 5.5 (ref 5.0–8.0)

## 2022-11-06 LAB — WET PREP FOR TRICH, YEAST, CLUE

## 2022-11-06 NOTE — Progress Notes (Signed)
      Subjective: Bonnie Arnold is a 32 y.o. female who complains of vaginal itching, vaginal burning, burning at end of urine stream. Also with lump inside vagina. Symptoms began Sunday. Had intercourse Friday (no new partner but first intercourse since last ov and treatment for BV). Would like STI screen.    Review of Systems  All other systems reviewed and are negative.   Past Medical History:  Diagnosis Date   Abnormal Pap smear of cervix    2019   Anemia    Anxiety    Ovarian cyst    Thyroid disease       Objective:  Today's Vitals   11/06/22 1016  BP: 104/76   There is no height or weight on file to calculate BMI.   -General: no acute distress -Vulva: without lesions or discharge -Vagina: discharge present, aptima swab and wet prep obtained -Cervix: no lesion or discharge, no CMT -Perineum: no lesions -Uterus: Mobile, non tender -Adnexa: no masses or tenderness  Urine dipstick shows: unable to read, took AZO Microscopic wet-mount exam shows negative for pathogens, normal epithelial cells.   Raynelle Fanning, CMA present for exam  Assessment:/Plan:   1. Dysuria - Urinalysis,Complete w/RFL Culture  2. Vaginal burning Reassured no bump found, normal anatomy - WET PREP FOR TRICH, YEAST, CLUE  3. Screening for STDs (sexually transmitted diseases) - SURESWAB CT/NG/T. vaginalis    Will contact patient with results of testing completed today. Avoid intercourse until symptoms are resolved. Safe sex encouraged. Avoid the use of soaps or perfumed products in the peri area. Avoid tub baths and sitting in sweaty or wet clothing for prolonged periods of time.

## 2022-11-07 ENCOUNTER — Ambulatory Visit: Payer: Medicaid Other | Admitting: Obstetrics & Gynecology

## 2022-11-07 ENCOUNTER — Telehealth: Payer: Self-pay

## 2022-11-07 DIAGNOSIS — R319 Hematuria, unspecified: Secondary | ICD-10-CM

## 2022-11-07 LAB — URINALYSIS, COMPLETE W/RFL CULTURE
Glucose, UA: NEGATIVE
Hyaline Cast: NONE SEEN /LPF
Protein, ur: NEGATIVE
RBC / HPF: NONE SEEN /HPF (ref 0–2)
Specific Gravity, Urine: 1.02 (ref 1.001–1.035)

## 2022-11-07 LAB — SURESWAB CT/NG/T. VAGINALIS
C. trachomatis RNA, TMA: NOT DETECTED
N. gonorrhoeae RNA, TMA: NOT DETECTED
Trichomonas vaginalis RNA: NOT DETECTED

## 2022-11-07 LAB — URINE CULTURE
MICRO NUMBER:: 15106953
Result:: NO GROWTH
SPECIMEN QUALITY:: ADEQUATE

## 2022-11-07 LAB — CULTURE INDICATED

## 2022-11-07 MED ORDER — SULFAMETHOXAZOLE-TRIMETHOPRIM 800-160 MG PO TABS: 1.0000 | ORAL_TABLET | Freq: Two times a day (BID) | ORAL | 0 refills | Status: AC

## 2022-11-07 NOTE — Telephone Encounter (Signed)
"  No allergy to ABTx.  Please send Bactrim DS 1 tab PO BID x 3 days. Dr. Elbert Ewings"  Pt notified and voiced understanding. Rx sent. Will route to provider for final review and close.

## 2022-11-07 NOTE — Telephone Encounter (Signed)
Pt seen yesterday for dysuria, vaginal burning/itching.   UA unable to read due to pt taking AZO, ucx/sureswab pending. Wet prep-WNL  Pt calling to report blood in urine today. Enough to change color of urine from yellow to pink. Reports not time for cycle either. LMP: 10/23/22.   Pt has not been prescribed any meds, only provided with recommendations to avoid.  Please advise.

## 2022-12-09 NOTE — Telephone Encounter (Signed)
AEX with ML on 05/29/22 PAP Ascus, HPV positive. Colpo recommended. Patient no show for appt 08/13/22 and 09/24/22.   Patient was seen in office on 11/06/22 for problem visit with JC.   Call placed to patient x2, call would not go through.    MyChart message to patient.   Dr. Edward Jolly -please review. Would the recommendation still be to proceed with colposcopy?

## 2022-12-09 NOTE — Telephone Encounter (Signed)
I recommend pap, HR HPV testing, and colposcopy.

## 2022-12-10 NOTE — Telephone Encounter (Signed)
Last read by Gretchen Portela [name] at  9:36 AM on 12/09/2022.

## 2022-12-16 NOTE — Telephone Encounter (Signed)
MyChart message read.   No response from patient.   Dr. Edward Jolly -please review and advise.

## 2022-12-18 NOTE — Telephone Encounter (Signed)
Routing to [employer].

## 2022-12-18 NOTE — Telephone Encounter (Signed)
I recommend patient receive a formal letter recommending pap, HR HPV testing, and colposcopy.   Please also ask in the letter if there are barriers to her receiving care, as she has failed appointments for the colposcopy.

## 2022-12-22 NOTE — Telephone Encounter (Signed)
Letter pended, copy to Dr. Edward Jolly to review and sign.

## 2022-12-24 NOTE — Telephone Encounter (Signed)
Please add a sentence at the bottom of the letter:  "Please also let us know if there are any barriers to your receiving care."  I can then sign the letter.

## 2022-12-25 NOTE — Telephone Encounter (Signed)
Letter mailed to address on file.   Encounter closed.

## 2022-12-25 NOTE — Telephone Encounter (Signed)
Letter printed, copy to Dr. Edward Jolly to review and sign.

## 2023-08-05 ENCOUNTER — Ambulatory Visit
